# Patient Record
Sex: Male | Born: 1970
Health system: Southern US, Community
[De-identification: ages and names within clinical notes are randomized; demographics above are authoritative.]

## PROBLEM LIST (undated history)

## (undated) DIAGNOSIS — T7840XA Allergy, unspecified, initial encounter: Secondary | ICD-10-CM

## (undated) DIAGNOSIS — K200 Eosinophilic esophagitis: Secondary | ICD-10-CM

## (undated) HISTORY — DX: Allergy, unspecified, initial encounter: T78.40XA

## (undated) HISTORY — PX: VASECTOMY: SHX75

## (undated) HISTORY — PX: WISDOM TOOTH EXTRACTION: SHX21

## (undated) HISTORY — DX: Eosinophilic esophagitis: K20.0

---

## 2005-11-03 ENCOUNTER — Other Ambulatory Visit: Admission: RE | Admit: 2005-11-03 | Discharge: 2005-11-03 | Payer: Self-pay | Admitting: Urology

## 2012-05-03 ENCOUNTER — Encounter: Payer: Self-pay | Admitting: Orthopedic Surgery

## 2012-05-03 ENCOUNTER — Ambulatory Visit (INDEPENDENT_AMBULATORY_CARE_PROVIDER_SITE_OTHER): Payer: BC Managed Care – PPO

## 2012-05-03 ENCOUNTER — Ambulatory Visit (INDEPENDENT_AMBULATORY_CARE_PROVIDER_SITE_OTHER): Payer: BC Managed Care – PPO | Admitting: Orthopedic Surgery

## 2012-05-03 VITALS — BP 90/62 | Ht 70.0 in | Wt 164.0 lb

## 2012-05-03 DIAGNOSIS — M25561 Pain in right knee: Secondary | ICD-10-CM

## 2012-05-03 DIAGNOSIS — M25569 Pain in unspecified knee: Secondary | ICD-10-CM

## 2012-05-03 DIAGNOSIS — M25469 Effusion, unspecified knee: Secondary | ICD-10-CM | POA: Insufficient documentation

## 2012-05-03 NOTE — Patient Instructions (Signed)
Wear compression sleeve

## 2012-05-03 NOTE — Progress Notes (Signed)
  Subjective:    Patient ID: Russell Chen, male    DOB: 10-May-1971, 41 y.o.   MRN: [id number] Chief Complaint  Patient presents with  . Knee Pain    right knee pain x 2 months, sudden onset, no known injury    HPI Comments: Mass right knee since June 2013  No trauma  Treated with naprosyn    He has a slight dull, aching pain in the anterior aspect of his knee associated with a cystic mass and a small foreign body possibly in the subcutaneous tissue, associated with some swelling 1/10 pain   Knee Pain       Review of Systems  All other systems reviewed and are negative.  seasonal allergies     Objective:   Physical Exam  Constitutional: He is oriented to person, place, and time. He appears well-developed and well-nourished.  Cardiovascular: Intact distal pulses.   Pulmonary/Chest: Effort normal.  Musculoskeletal:       Right knee: He exhibits swelling. He exhibits normal range of motion, no effusion, no ecchymosis, no deformity, no laceration, no erythema, normal alignment, no LCL laxity, normal patellar mobility, no bony tenderness, normal meniscus and no MCL laxity. tenderness found. No medial joint line, no lateral joint line, no MCL, no LCL and no patellar tendon tenderness noted.       Legs: Neurological: He is alert and oriented to person, place, and time. He has normal reflexes. He exhibits normal muscle tone.  Skin: Skin is warm and dry. No rash noted. No erythema. No pallor.  Psychiatric: He has a normal mood and affect. His behavior is normal. Judgment and thought content normal.     Imaging shows soft tissue mass over the RIGHT knee joint surfaces are normal     Assessment & Plan:  Cystic mass prepatellar bursa with a small possible foreign body in the subcutaneous tissue.  Procedure note Aspiration injection RIGHT prepatellar bursa  Medication used lidocaine 1% 1 cc Depo-Medrol 40 mg 1 cc  Preparation alcohol and ethyl chloride Verbal consent, and  timeout procedure. Technique: the skin was prepped with alcohol and anesthetized with skin refrigerant. We aspirated approximately 5 cc of bloody fluid and then injected the Depo-Medrol and lidocaine.  No complications

## 2012-05-17 ENCOUNTER — Encounter: Payer: Self-pay | Admitting: Orthopedic Surgery

## 2012-05-17 ENCOUNTER — Ambulatory Visit (INDEPENDENT_AMBULATORY_CARE_PROVIDER_SITE_OTHER): Payer: BC Managed Care – PPO | Admitting: Orthopedic Surgery

## 2012-05-17 VITALS — BP 90/60 | Ht 70.0 in | Wt 164.0 lb

## 2012-05-17 DIAGNOSIS — M25469 Effusion, unspecified knee: Secondary | ICD-10-CM

## 2012-05-17 NOTE — Patient Instructions (Addendum)
Scheduling right knee excision of mass and foreign body   You have been scheduled for surgery  Please Go to your preoperative appointment  Please stop all blood thinners ibuprofen Naprosyn aspirin Plavix Coumadin

## 2012-05-17 NOTE — Progress Notes (Signed)
Patient ID: Russell Chen, male   DOB: [birth year], 41 y.o.   MRN: [id number] Chief Complaint  Patient presents with  . Follow-up    follow up right knee bursa    BP 90/60  Ht 5\' 10"  (1.778 m)  Wt 164 lb (74.39 kg)  BMI 23.53 kg/m2  Followup after aspiration of the right knee for prepatellar bursitis with cyst formation. No improvement  There's also a foreign body in the soft tissue  Review of systems no fever or chills weight loss  Exam repeat exam shows a mass over the right knee does not felt the knee joint are his ambulation his knee is stable strength is normal skin is intact there is no surrounding erythema neurovascular exam is normal distally  Mass right knee from prepatellar bursitis  Arm body  Excision of mass and foreign body has been scheduled. The patient has been counseled on the risks and benefits of the procedure including potential complications wishes to proceed  Surgery will be scheduled at a later time including a full history and physical

## 2012-05-18 ENCOUNTER — Encounter (HOSPITAL_COMMUNITY): Payer: Self-pay | Admitting: Pharmacy Technician

## 2012-05-22 ENCOUNTER — Encounter (HOSPITAL_COMMUNITY): Payer: Self-pay

## 2012-05-22 ENCOUNTER — Encounter (HOSPITAL_COMMUNITY)
Admission: RE | Admit: 2012-05-22 | Discharge: 2012-05-22 | Disposition: A | Discharge status: Home / Self Care | Payer: BC Managed Care – PPO | Source: Ambulatory Visit | Attending: Orthopedic Surgery | Admitting: Orthopedic Surgery

## 2012-05-22 LAB — SURGICAL PCR SCREEN: MRSA, PCR: NEGATIVE

## 2012-05-22 NOTE — Patient Instructions (Addendum)
   20 Russell Chen  05/22/2012   Your procedure is scheduled on:   05/28/2012  Report to [facility] at  900  AM.  Call this number if you have problems the morning of surgery: [phone]   Remember:   Do not eat food:After Midnight.  May have clear liquids:until Midnight .    Take these medicines the morning of surgery with A SIP OF WATER:  none   Do not wear jewelry, make-up or nail polish.  Do not wear lotions, powders, or perfumes. You may wear deodorant.  Do not shave 48 hours prior to surgery. Men may shave face and neck.  Do not bring valuables to the hospital.  Contacts, dentures or bridgework may not be worn into surgery.  Leave suitcase in the car. After surgery it may be brought to your room.  For patients admitted to the hospital, checkout time is 11:00 AM the day of discharge.   Patients discharged the day of surgery will not be allowed to drive home.  Name and phone number of your driver: family  Special Instructions: CHG Shower Use Special Wash: 1/2 bottle night before surgery and 1/2 bottle morning of surgery.   Please read over the following fact sheets that you were given: Pain Booklet, MRSA Information, Surgical Site Infection Prevention, Anesthesia Post-op Instructions and Care and Recovery After Surgery PATIENT INSTRUCTIONS POST-ANESTHESIA  IMMEDIATELY FOLLOWING SURGERY:  Do not drive or operate machinery for the first twenty four hours after surgery.  Do not make any important decisions for twenty four hours after surgery or while taking narcotic pain medications or sedatives.  If you develop intractable nausea and vomiting or a severe headache please notify your doctor immediately.  FOLLOW-UP:  Please make an appointment with your surgeon as instructed. You do not need to follow up with anesthesia unless specifically instructed to do so.  WOUND CARE INSTRUCTIONS (if applicable):  Keep a dry clean dressing on the anesthesia/puncture wound site if there is drainage.   Once the wound has quit draining you may leave it open to air.  Generally you should leave the bandage intact for twenty four hours unless there is drainage.  If the epidural site drains for more than 36-48 hours please call the anesthesia department.  QUESTIONS?:  Please feel free to call your physician or the hospital operator if you have any questions, and they will be happy to assist you.

## 2012-05-23 ENCOUNTER — Telehealth: Payer: Self-pay | Admitting: Orthopedic Surgery

## 2012-05-23 NOTE — Telephone Encounter (Signed)
Contact to insurer,[employer], ph# [phone], regarding pre-authorization information, out-patient surgery, scheduled 05/25/12 at [facility].  CPT codes 09811, ICD9 code 726.65.  Per Maureen Ralphs, at insurer, no pre-authorization is required.  Her name and today's date 05/23/12 for reference.

## 2012-05-24 NOTE — H&P (Signed)
  Subjective:   Patient ID: Russell Chen, male DOB: 01-20-1971, 41 y.o. MRN: [id number]  Chief Complaint   Patient presents with   .  Knee Pain     right knee pain x 2 months, sudden onset, no known injury    HPI Comments: Mass right knee since June 2013  No trauma  Treated with naprosyn  He has a slight dull, aching pain in the anterior aspect of his knee associated with a cystic mass and a small foreign body possibly in the subcutaneous tissue, associated with some swelling  1/10 pain   WE ASPIRATED THE MASS AND IT CAME BACK. Knee Pain   Review of Systems  All other systems reviewed and are negative.  seasonal allergies   No past medical history on file.  No past surgical history on file.  Family History  Problem Relation Age of Onset  . Heart disease    . Arthritis    . Asthma    . Diabetes       reports that he quit smoking about 20 years ago. His smoking use included Cigarettes. He quit after .5 years of use. He does not have any smokeless tobacco history on file. He reports that he does not drink alcohol or use illicit drugs.  Objective:   Physical Exam  Constitutional: He is oriented to person, place, and time. He appears well-developed and well-nourished.  Cardiovascular: Intact distal pulses.  Pulmonary/Chest: Effort normal.  Musculoskeletal:  Right knee: He exhibits swelling. He exhibits normal range of motion, no effusion, no ecchymosis, no deformity, no laceration, no erythema, normal alignment, no LCL laxity, normal patellar mobility, no bony tenderness, normal meniscus and no MCL laxity. tenderness found. No medial joint line, no lateral joint line, no MCL, no LCL and no patellar tendon tenderness noted.  Legs: Neurological: He is alert and oriented to person, place, and time. He has normal reflexes. He exhibits normal muscle tone.  Skin: Skin is warm and dry. No rash noted. No erythema. No pallor.  Psychiatric: He has a normal mood and affect. His behavior  is normal. Judgment and thought content normal.   Imaging shows soft tissue mass over the RIGHT knee joint surfaces are normal   Assessment & Plan:   Cystic mass prepatellar bursa with a small possible foreign body in the subcutaneous tissue.  EXCISION OF MASS/BURSA RIGHT KNEE AND REMOVE FOREIGN BODY

## 2012-05-25 ENCOUNTER — Encounter (HOSPITAL_COMMUNITY): Payer: Self-pay | Admitting: Anesthesiology

## 2012-05-25 ENCOUNTER — Ambulatory Visit (HOSPITAL_COMMUNITY)
Admission: RE | Admit: 2012-05-25 | Discharge: 2012-05-25 | Disposition: A | Discharge status: Home / Self Care | Payer: BC Managed Care – PPO | Source: Ambulatory Visit | Attending: Orthopedic Surgery | Admitting: Orthopedic Surgery

## 2012-05-25 ENCOUNTER — Ambulatory Visit (HOSPITAL_COMMUNITY): Payer: BC Managed Care – PPO | Admitting: Anesthesiology

## 2012-05-25 ENCOUNTER — Encounter (HOSPITAL_COMMUNITY)
Admission: RE | Disposition: A | Discharge status: Home / Self Care | Payer: Self-pay | Source: Ambulatory Visit | Attending: Orthopedic Surgery

## 2012-05-25 ENCOUNTER — Encounter (HOSPITAL_COMMUNITY): Payer: Self-pay | Admitting: *Deleted

## 2012-05-25 DIAGNOSIS — M799 Soft tissue disorder, unspecified: Secondary | ICD-10-CM | POA: Insufficient documentation

## 2012-05-25 DIAGNOSIS — M25469 Effusion, unspecified knee: Secondary | ICD-10-CM

## 2012-05-25 DIAGNOSIS — M6740 Ganglion, unspecified site: Secondary | ICD-10-CM | POA: Insufficient documentation

## 2012-05-25 DIAGNOSIS — M795 Residual foreign body in soft tissue: Secondary | ICD-10-CM | POA: Insufficient documentation

## 2012-05-25 DIAGNOSIS — R2240 Localized swelling, mass and lump, unspecified lower limb: Secondary | ICD-10-CM

## 2012-05-25 DIAGNOSIS — R29898 Other symptoms and signs involving the musculoskeletal system: Secondary | ICD-10-CM

## 2012-05-25 DIAGNOSIS — Z01812 Encounter for preprocedural laboratory examination: Secondary | ICD-10-CM | POA: Insufficient documentation

## 2012-05-25 DIAGNOSIS — M7040 Prepatellar bursitis, unspecified knee: Secondary | ICD-10-CM

## 2012-05-25 HISTORY — PX: MASS EXCISION: SHX2000

## 2012-05-25 HISTORY — PX: FOREIGN BODY REMOVAL: SHX962

## 2012-05-25 SURGERY — EXCISION MASS
Anesthesia: General | Site: Knee | Laterality: Right | Wound class: Clean

## 2012-05-25 MED ORDER — LIDOCAINE HCL (CARDIAC) 10 MG/ML IV SOLN
INTRAVENOUS | Status: DC | PRN
  Administered 2012-05-25: 40 mg via INTRAVENOUS

## 2012-05-25 MED ORDER — MIDAZOLAM HCL 2 MG/2ML IJ SOLN
1.0000 mg | INTRAMUSCULAR | Status: DC | PRN
  Administered 2012-05-25 (×2): 2 mg via INTRAVENOUS

## 2012-05-25 MED ORDER — HYDROCODONE-ACETAMINOPHEN 5-325 MG PO TABS: 1.0000 | ORAL_TABLET | Freq: Four times a day (QID) | ORAL | Status: AC | PRN

## 2012-05-25 MED ORDER — ONDANSETRON HCL 4 MG/2ML IJ SOLN
INTRAMUSCULAR | Status: AC
  Filled 2012-05-25: qty 2

## 2012-05-25 MED ORDER — FENTANYL CITRATE 0.05 MG/ML IJ SOLN
INTRAMUSCULAR | Status: AC
  Filled 2012-05-25: qty 5

## 2012-05-25 MED ORDER — BUPIVACAINE-EPINEPHRINE PF 0.5-1:200000 % IJ SOLN
INTRAMUSCULAR | Status: AC
  Filled 2012-05-25: qty 20

## 2012-05-25 MED ORDER — FENTANYL CITRATE 0.05 MG/ML IJ SOLN
INTRAMUSCULAR | Status: DC | PRN
  Administered 2012-05-25: 100 ug via INTRAVENOUS

## 2012-05-25 MED ORDER — BUPIVACAINE-EPINEPHRINE PF 0.5-1:200000 % IJ SOLN
INTRAMUSCULAR | Status: DC | PRN
  Administered 2012-05-25: 30 mL

## 2012-05-25 MED ORDER — LIDOCAINE HCL (PF) 1 % IJ SOLN
INTRAMUSCULAR | Status: AC
  Filled 2012-05-25: qty 5

## 2012-05-25 MED ORDER — PROPOFOL 10 MG/ML IV EMUL
INTRAVENOUS | Status: AC
  Filled 2012-05-25: qty 20

## 2012-05-25 MED ORDER — MIDAZOLAM HCL 2 MG/2ML IJ SOLN
INTRAMUSCULAR | Status: AC
  Filled 2012-05-25: qty 2

## 2012-05-25 MED ORDER — LACTATED RINGERS IV SOLN
INTRAVENOUS | Status: DC
  Administered 2012-05-25: 13:00:00 via INTRAVENOUS
  Administered 2012-05-25: 1000 mL via INTRAVENOUS

## 2012-05-25 MED ORDER — CHLORHEXIDINE GLUCONATE 4 % EX LIQD: 60.0000 mL | Freq: Once | CUTANEOUS | Status: DC

## 2012-05-25 MED ORDER — KETOROLAC TROMETHAMINE 30 MG/ML IJ SOLN
30.0000 mg | Freq: Once | INTRAMUSCULAR | Status: AC
  Administered 2012-05-25: 30 mg via INTRAVENOUS

## 2012-05-25 MED ORDER — GLYCOPYRROLATE 0.2 MG/ML IJ SOLN
0.2000 mg | Freq: Once | INTRAMUSCULAR | Status: AC
  Administered 2012-05-25: 0.2 mg via INTRAVENOUS

## 2012-05-25 MED ORDER — GLYCOPYRROLATE 0.2 MG/ML IJ SOLN
INTRAMUSCULAR | Status: AC
  Filled 2012-05-25: qty 1

## 2012-05-25 MED ORDER — CEFAZOLIN SODIUM-DEXTROSE 2-3 GM-% IV SOLR
2.0000 g | INTRAVENOUS | Status: AC
  Administered 2012-05-25: 2 g via INTRAVENOUS

## 2012-05-25 MED ORDER — ONDANSETRON HCL 4 MG/2ML IJ SOLN
4.0000 mg | Freq: Once | INTRAMUSCULAR | Status: AC
  Administered 2012-05-25: 4 mg via INTRAVENOUS

## 2012-05-25 MED ORDER — FENTANYL CITRATE 0.05 MG/ML IJ SOLN: 25.0000 ug | INTRAMUSCULAR | Status: DC | PRN

## 2012-05-25 MED ORDER — KETOROLAC TROMETHAMINE 30 MG/ML IJ SOLN
INTRAMUSCULAR | Status: AC
  Filled 2012-05-25: qty 1

## 2012-05-25 MED ORDER — PROPOFOL 10 MG/ML IV BOLUS
INTRAVENOUS | Status: DC | PRN
  Administered 2012-05-25: 250 mg via INTRAVENOUS

## 2012-05-25 MED ORDER — SODIUM CHLORIDE 0.9 % IR SOLN
Status: DC | PRN
  Administered 2012-05-25: 1000 mL

## 2012-05-25 MED ORDER — CEFAZOLIN SODIUM-DEXTROSE 2-3 GM-% IV SOLR
INTRAVENOUS | Status: AC
  Filled 2012-05-25: qty 50

## 2012-05-25 MED ORDER — ONDANSETRON HCL 4 MG/2ML IJ SOLN: 4.0000 mg | Freq: Once | INTRAMUSCULAR | Status: DC | PRN

## 2012-05-25 SURGICAL SUPPLY — 50 items
ADH SKN CLS APL DERMABOND .7 (GAUZE/BANDAGES/DRESSINGS) ×2
BAG HAMPER (MISCELLANEOUS) ×2 IMPLANT
BANDAGE ELASTIC 6 VELCRO NS (GAUZE/BANDAGES/DRESSINGS) ×1 IMPLANT
BANDAGE ESMARK 4X12 BL STRL LF (DISPOSABLE) ×1 IMPLANT
BANDAGE GAUZE ELAST BULKY 4 IN (GAUZE/BANDAGES/DRESSINGS) ×2 IMPLANT
BLADE SURG SZ10 CARB STEEL (BLADE) ×1 IMPLANT
BNDG CMPR 12X4 ELC STRL LF (DISPOSABLE) ×1
BNDG COHESIVE 4X5 TAN NS LF (GAUZE/BANDAGES/DRESSINGS) ×2 IMPLANT
BNDG ESMARK 4X12 BLUE STRL LF (DISPOSABLE) ×2
CHLORAPREP W/TINT 26ML (MISCELLANEOUS) ×2 IMPLANT
CLOTH BEACON ORANGE TIMEOUT ST (SAFETY) ×2 IMPLANT
COVER LIGHT HANDLE STERIS (MISCELLANEOUS) ×4 IMPLANT
CUFF TOURNIQUET SINGLE 34IN LL (TOURNIQUET CUFF) ×1 IMPLANT
DECANTER SPIKE VIAL GLASS SM (MISCELLANEOUS) ×2 IMPLANT
DERMABOND ADVANCED (GAUZE/BANDAGES/DRESSINGS) ×2
DERMABOND ADVANCED .7 DNX12 (GAUZE/BANDAGES/DRESSINGS) IMPLANT
DRAPE INCISE IOBAN 44X35 STRL (DRAPES) ×1 IMPLANT
DRSG XEROFORM 1X8 (GAUZE/BANDAGES/DRESSINGS) ×2 IMPLANT
ELECT REM PT RETURN 9FT ADLT (ELECTROSURGICAL) ×2
ELECTRODE REM PT RTRN 9FT ADLT (ELECTROSURGICAL) ×1 IMPLANT
FORMALIN 10 PREFIL 120ML (MISCELLANEOUS) ×3 IMPLANT
GAUZE XEROFORM 5X9 LF (GAUZE/BANDAGES/DRESSINGS) ×1 IMPLANT
GLOVE BIO SURGEON STRL SZ 6.5 (GLOVE) ×1 IMPLANT
GLOVE BIOGEL PI IND STRL 7.0 (GLOVE) IMPLANT
GLOVE BIOGEL PI INDICATOR 7.0 (GLOVE) ×3
GLOVE ECLIPSE 6.5 STRL STRAW (GLOVE) ×1 IMPLANT
GLOVE ECLIPSE 7.0 STRL STRAW (GLOVE) ×1 IMPLANT
GLOVE EXAM NITRILE MD LF STRL (GLOVE) ×1 IMPLANT
GLOVE SKINSENSE NS SZ8.0 LF (GLOVE) ×1
GLOVE SKINSENSE STRL SZ8.0 LF (GLOVE) ×1 IMPLANT
GLOVE SS N UNI LF 8.5 STRL (GLOVE) ×2 IMPLANT
GOWN STRL REIN XL XLG (GOWN DISPOSABLE) ×8 IMPLANT
INST SET MINOR BONE (KITS) ×2 IMPLANT
KIT ROOM TURNOVER APOR (KITS) ×2 IMPLANT
MANIFOLD NEPTUNE II (INSTRUMENTS) ×2 IMPLANT
NDL HYPO 21X1.5 SAFETY (NEEDLE) ×1 IMPLANT
NEEDLE HYPO 21X1.5 SAFETY (NEEDLE) ×2 IMPLANT
NS IRRIG 1000ML POUR BTL (IV SOLUTION) ×2 IMPLANT
PACK BASIC LIMB (CUSTOM PROCEDURE TRAY) ×2 IMPLANT
PAD ABD 5X9 TENDERSORB (GAUZE/BANDAGES/DRESSINGS) ×1 IMPLANT
PAD ARMBOARD 7.5X6 YLW CONV (MISCELLANEOUS) ×2 IMPLANT
PADDING CAST COTTON 6X4 STRL (CAST SUPPLIES) ×1 IMPLANT
SET BASIN LINEN APH (SET/KITS/TRAYS/PACK) ×2 IMPLANT
SPONGE GAUZE 4X4 12PLY (GAUZE/BANDAGES/DRESSINGS) ×2 IMPLANT
SPONGE LAP 18X18 X RAY DECT (DISPOSABLE) ×1 IMPLANT
STRIP CLOSURE SKIN 1/2X4 (GAUZE/BANDAGES/DRESSINGS) ×2 IMPLANT
SUT MON AB 2-0 SH 27 (SUTURE) ×2
SUT MON AB 2-0 SH27 (SUTURE) ×1 IMPLANT
SYR 30ML LL (SYRINGE) ×1 IMPLANT
SYR BULB IRRIGATION 50ML (SYRINGE) ×2 IMPLANT

## 2012-05-25 NOTE — Anesthesia Postprocedure Evaluation (Signed)
Anesthesia Post Note  Patient: Russell Chen  Procedure(s) Performed: Procedure(s) (LRB): EXCISION MASS (Right) FOREIGN BODY REMOVAL ADULT (Right)  Anesthesia type: General  Patient location: PACU  Post pain: Pain level controlled  Post assessment: Post-op Vital signs reviewed, Patient's Cardiovascular Status Stable, Respiratory Function Stable, Patent Airway, No signs of Nausea or vomiting and Pain level controlled  Last Vitals:  Filed Vitals:   05/25/12 1332  BP: 99/65  Pulse: 58  Temp: 36.5 C  Resp: 26    Post vital signs: Reviewed and stable  Level of consciousness: awake and alert   Complications: No apparent anesthesia complications

## 2012-05-25 NOTE — Op Note (Signed)
05/25/2012  1:29 PM  PATIENT:  Otho Perl  41 y.o. male  PRE-OPERATIVE DIAGNOSIS:  right knee mass and foreign body  POST-OPERATIVE DIAGNOSIS:  right knee mass and foreign body  PROCEDURE:  Procedure(s) (LRB) with comments: EXCISION MASS (Right) - right knee mass FOREIGN BODY REMOVAL ADULT (Right) - right knee foreign body  Operative findings  #1 there was a small 3 x 3 mm dark firm nodular mass in the subcutaneous tissue of the right knee just superomedial to the larger mass.  #2 there was a large 3.5 x 4 cm thickened blood-filled cystic mass under the subcutaneous tissue of the right knee superficial to the patellar tendon. It did not appear to involve the peritenon.  Details of procedure: The patient was identified in the preop area. The site was confirmed as the right knee and was marked. The chart was reviewed. The patient was taken to the operating room and general anesthesia was administered with an element technique without complication. 2 g of Ancef were started IV per weight base criteria. After sterile prep and drape the timeout procedure was completed. The patient identification was confirmed, IV antibiotics were confirmed to be given before skin incision, equipment for the procedure confirmed. Site was confirmed.  The limb was exsanguinated with a 4 inch Esmarch and the tourniquet was inflated to 300 mm of mercury. A midline incision was made over the mass and extended slightly proximal and distal to it. Blunt dissection was carried around the mass and the small mass was removed. The larger mass was then removed with blunt and sharp dissection and was separated from the underlying peritenon. There appeared to be a stalk coming from the superolateral joint margin.  The mass was opened and found to be thickened walls with blood which was old inside.  Exploration of the wound revealed no other lesions or masses. The wound was irrigated with copious amounts of saline.  A  running 2-0 Monocryl suture was placed and then Dermabond was applied along with Steri-Strips and a sterile dressing. The tourniquet was released the limb had excellent capillary refill color.  A sterile bandage was applied with a Ace bandage.    SURGEON:  Surgeon(s) and Role:    * Vickki Hearing, MD - Primary  PHYSICIAN ASSISTANT:   ASSISTANTS: Joppa Nation   ANESTHESIA:   general  EBL:  Total I/O In: 1200 [I.V.:1200] Out: 0   BLOOD ADMINISTERED:none  DRAINS: none   LOCAL MEDICATIONS USED:  MARCAINE 0 .5% , Amount: 30 ml and OTHER epi   SPECIMEN:  Source of Specimen:  2 specimens one small 1 as described above in one large cystic mass right knee the pain is tissue  DISPOSITION OF SPECIMEN:  PATHOLOGY  COUNTS:  YES  TOURNIQUET:   Total Tourniquet Time Documented: Thigh (Right) - 32 minutes  DICTATION: .Dragon Dictation  PLAN OF CARE: Discharge to home after PACU  PATIENT DISPOSITION:  PACU - hemodynamically stable.   Delay start of Pharmacological VTE agent (>24hrs) due to surgical blood loss or risk of bleeding: not applicable

## 2012-05-25 NOTE — Brief Op Note (Addendum)
05/25/2012  1:29 PM  PATIENT:  Russell Chen  41 y.o. male  PRE-OPERATIVE DIAGNOSIS:  right knee mass and foreign body  POST-OPERATIVE DIAGNOSIS:  right knee mass and foreign body  PROCEDURE:  Procedure(s) (LRB) with comments: EXCISION MASS (Right) - right knee mass FOREIGN BODY REMOVAL ADULT (Right) - right knee foreign body  Operative findings  #1 there was a small 3 x 3 mm dark firm nodular mass in the subcutaneous tissue of the right knee just superomedial to the larger mass.  #2 there was a large 3.5 x 4 cm thickened blood-filled cystic mass under the subcutaneous tissue of the right knee superficial to the patellar tendon. It did not appear to involve the peritenon.  Details of procedure: The patient was identified in the preop area. The site was confirmed as the right knee and was marked. The chart was reviewed. The patient was taken to the operating room and general anesthesia was administered with an element technique without complication. 2 g of Ancef were started IV per weight base criteria. After sterile prep and drape the timeout procedure was completed. The patient identification was confirmed, IV antibiotics were confirmed to be given before skin incision, equipment for the procedure confirmed. Site was confirmed.  The limb was exsanguinated with a 4 inch Esmarch and the tourniquet was inflated to 300 mm of mercury. A midline incision was made over the mass and extended slightly proximal and distal to it. Blunt dissection was carried around the mass and the small mass was removed. The larger mass was then removed with blunt and sharp dissection and was separated from the underlying peritenon. There appeared to be a stalk coming from the superolateral joint margin.  The mass was opened and found to be thickened walls with blood which was old inside.  Exploration of the wound revealed no other lesions or masses. The wound was irrigated with copious amounts of saline.  A  running 2-0 Monocryl suture was placed and then Dermabond was applied along with Steri-Strips and a sterile dressing. The tourniquet was released the limb had excellent capillary refill color.  A sterile bandage was applied with a Ace bandage.    SURGEON:  Surgeon(s) and Role:    * Stanley E Harrison, MD - Primary  PHYSICIAN ASSISTANT:   ASSISTANTS: Betty Ashley   ANESTHESIA:   general  EBL:  Total I/O In: 1200 [I.V.:1200] Out: 0   BLOOD ADMINISTERED:none  DRAINS: none   LOCAL MEDICATIONS USED:  MARCAINE 0 .5% , Amount: 30 ml and OTHER epi   SPECIMEN:  Source of Specimen:  2 specimens one small 1 as described above in one large cystic mass right knee the pain is tissue  DISPOSITION OF SPECIMEN:  PATHOLOGY  COUNTS:  YES  TOURNIQUET:   Total Tourniquet Time Documented: Thigh (Right) - 32 minutes  DICTATION: .Dragon Dictation  PLAN OF CARE: Discharge to home after PACU  PATIENT DISPOSITION:  PACU - hemodynamically stable.   Delay start of Pharmacological VTE agent (>24hrs) due to surgical blood loss or risk of bleeding: not applicable 

## 2012-05-25 NOTE — Interval H&P Note (Signed)
History and Physical Interval Note:  05/25/2012 12:08 PM  Russell Chen  has presented today for surgery, with the diagnosis of right knee mass and foreign body  The various methods of treatment have been discussed with the patient and family. After consideration of risks, benefits and other options for treatment, the patient has consented to  Procedure(s) (LRB) with comments: EXCISION MASS (Right) - right knee mass FOREIGN BODY REMOVAL ADULT (Right) - right knee foreign body as a surgical intervention .  The patient's history has been reviewed, patient examined, no change in status, stable for surgery.  I have reviewed the patient's chart and labs.  Questions were answered to the patient's satisfaction.     Fuller Canada

## 2012-05-25 NOTE — Anesthesia Preprocedure Evaluation (Signed)
Anesthesia Evaluation  Patient identified by MRN, date of birth, ID band Patient awake    Reviewed: Allergy & Precautions, H&P , NPO status , Patient's Chart, lab work & pertinent test results  Airway Mallampati: I TM Distance: >3 FB Neck ROM: Full    Dental No notable dental hx.    Pulmonary neg pulmonary ROS,    Pulmonary exam normal       Cardiovascular negative cardio ROS  Rhythm:Regular Rate:Normal     Neuro/Psych negative neurological ROS  negative psych ROS   GI/Hepatic negative GI ROS, Neg liver ROS,   Endo/Other  negative endocrine ROS  Renal/GU negative Renal ROS     Musculoskeletal negative musculoskeletal ROS (+)   Abdominal Normal abdominal exam  (+)   Peds  Hematology negative hematology ROS (+)   Anesthesia Other Findings   Reproductive/Obstetrics                           Anesthesia Physical Anesthesia Plan  ASA: I  Anesthesia Plan: General   Post-op Pain Management:    Induction: Intravenous  Airway Management Planned: LMA  Additional Equipment:   Intra-op Plan:   Post-operative Plan: Extubation in OR  Informed Consent: I have reviewed the patients History and Physical, chart, labs and discussed the procedure including the risks, benefits and alternatives for the proposed anesthesia with the patient or authorized representative who has indicated his/her understanding and acceptance.     Plan Discussed with: CRNA  Anesthesia Plan Comments:         Anesthesia Quick Evaluation

## 2012-05-25 NOTE — Transfer of Care (Signed)
Immediate Anesthesia Transfer of Care Note  Patient: Russell Chen  Procedure(s) Performed: Procedure(s) (LRB): EXCISION MASS (Right) FOREIGN BODY REMOVAL ADULT (Right)  Patient Location: PACU  Anesthesia Type: General  Level of Consciousness: awake  Airway & Oxygen Therapy: Patient Spontanous Breathing and non-rebreather face mask  Post-op Assessment: Report given to PACU RN, Post -op Vital signs reviewed and stable and Patient moving all extremities  Post vital signs: Reviewed and stable  Complications: No apparent anesthesia complications

## 2012-05-28 ENCOUNTER — Encounter: Payer: Self-pay | Admitting: Orthopedic Surgery

## 2012-05-28 ENCOUNTER — Ambulatory Visit (INDEPENDENT_AMBULATORY_CARE_PROVIDER_SITE_OTHER): Payer: BC Managed Care – PPO | Admitting: Orthopedic Surgery

## 2012-05-28 VITALS — BP 110/70 | Ht 70.0 in | Wt 168.0 lb

## 2012-05-28 DIAGNOSIS — M6740 Ganglion, unspecified site: Secondary | ICD-10-CM

## 2012-05-28 DIAGNOSIS — M7040 Prepatellar bursitis, unspecified knee: Secondary | ICD-10-CM

## 2012-05-28 NOTE — Patient Instructions (Signed)
Shower ok    

## 2012-05-28 NOTE — Progress Notes (Signed)
Patient ID: Russell Chen, male   DOB: 1971/01/24, 41 y.o.   MRN: [id number] Post op   Chief Complaint  Patient presents with  . Routine Post Op    post op 1, SARK, DOS 05/25/12    The pathology report indicates the patient had a ganglion cyst in his right knee  His wound looks clean and dry. Return to work tomorrow. She refrain from any strenuous activity for the next 2 weeks now check his wound in 2 weeks. He is allowed to shower with a clear plastic dressing/Tegaderm over the wound

## 2012-05-29 ENCOUNTER — Encounter (HOSPITAL_COMMUNITY): Payer: Self-pay | Admitting: Orthopedic Surgery

## 2012-06-06 ENCOUNTER — Encounter: Payer: Self-pay | Admitting: Orthopedic Surgery

## 2012-06-06 ENCOUNTER — Ambulatory Visit (INDEPENDENT_AMBULATORY_CARE_PROVIDER_SITE_OTHER): Payer: BC Managed Care – PPO | Admitting: Orthopedic Surgery

## 2012-06-06 VITALS — BP 104/70 | Ht 70.0 in | Wt 168.0 lb

## 2012-06-06 DIAGNOSIS — M6740 Ganglion, unspecified site: Secondary | ICD-10-CM

## 2012-06-06 NOTE — Patient Instructions (Signed)
Regular activity  No kneeling x 1 week

## 2012-06-06 NOTE — Progress Notes (Signed)
Patient ID: Russell Chen, male   DOB: 1971/04/27, 41 y.o.   MRN: [id number] Chief Complaint  Patient presents with  . Follow-up    Wound check.     Post op excision of cyst right knee   Clean healed wound without clinical signs of infection   D/C

## 2013-01-10 ENCOUNTER — Encounter: Payer: Self-pay | Admitting: *Deleted

## 2013-01-17 ENCOUNTER — Ambulatory Visit (INDEPENDENT_AMBULATORY_CARE_PROVIDER_SITE_OTHER): Payer: BC Managed Care – PPO | Admitting: Family Medicine

## 2013-01-17 ENCOUNTER — Encounter: Payer: Self-pay | Admitting: Family Medicine

## 2013-01-17 VITALS — BP 100/70 | HR 60 | Ht 69.0 in | Wt 167.2 lb

## 2013-01-17 DIAGNOSIS — Z0000 Encounter for general adult medical examination without abnormal findings: Secondary | ICD-10-CM

## 2013-01-17 NOTE — Progress Notes (Signed)
  Subjective:    Patient ID: Russell Chen, male    DOB: 11-16-1970, 42 y.o.   MRN: [id number]  HPI Patient overall doing well eats healthy he tries exercise moderate amount denies any chest pressure tightness pain shortness of breath. Denies rectal bleeding or hematuria. Gets adequate sleep. Safety measures dietary measures exercise all reviewed with patient. Patient does not smoke or drink. Past medical history not pertinent for any type of major issues.   Review of Systems  Constitutional: Negative for fever, activity change and appetite change.  HENT: Negative for congestion, rhinorrhea and neck pain.   Eyes: Negative for discharge.  Respiratory: Negative for cough and wheezing.   Cardiovascular: Negative for chest pain.  Gastrointestinal: Negative for vomiting, abdominal pain and blood in stool.  Genitourinary: Negative for frequency and difficulty urinating.  Skin: Negative for rash.  Allergic/Immunologic: Negative for environmental allergies and food allergies.  Neurological: Negative for weakness and headaches.  Psychiatric/Behavioral: Negative for agitation.       Objective:   Physical Exam  Constitutional: He appears well-developed and well-nourished.  HENT:  Head: Normocephalic and atraumatic.  Right Ear: External ear normal.  Left Ear: External ear normal.  Nose: Nose normal.  Mouth/Throat: Oropharynx is clear and moist.  Eyes: EOM are normal. Pupils are equal, round, and reactive to light.  Neck: Normal range of motion. Neck supple. No thyromegaly present.  Cardiovascular: Normal rate, regular rhythm and normal heart sounds.   No murmur heard. Pulmonary/Chest: Effort normal and breath sounds normal. No respiratory distress. He has no wheezes.  Abdominal: Soft. Bowel sounds are normal. He exhibits no distension and no mass. There is no tenderness.  Genitourinary: Penis normal.  Musculoskeletal: Normal range of motion. He exhibits no edema.  Lymphadenopathy:    He  has no cervical adenopathy.  Neurological: He is alert. He exhibits normal muscle tone.  Skin: Skin is warm and dry. No erythema.  Psychiatric: He has a normal mood and affect. His behavior is normal. Judgment normal.          Assessment & Plan:  On we'llwellness exam-overall good. His findings are good. Dietary measures safety measures all discussed with the patient. Patient should get lab work in addition to this I do recommend a routine followup in one years time. Prostate exam colonoscopy not indicated currently.

## 2013-01-22 LAB — LIPID PANEL
HDL: 47 mg/dL (ref >39)
LDL Cholesterol: 112 mg/dL — ABNORMAL HIGH (ref 0–99)
Total CHOL/HDL Ratio: 3.6 Ratio
VLDL: 9 mg/dL (ref 0–40)

## 2013-01-23 ENCOUNTER — Encounter: Payer: Self-pay | Admitting: Family Medicine

## 2013-07-15 ENCOUNTER — Encounter: Payer: Self-pay | Admitting: Family Medicine

## 2013-07-15 ENCOUNTER — Ambulatory Visit (INDEPENDENT_AMBULATORY_CARE_PROVIDER_SITE_OTHER): Payer: BC Managed Care – PPO | Admitting: Family Medicine

## 2013-07-15 VITALS — BP 110/70 | Temp 98.7°F | Ht 70.0 in | Wt 171.0 lb

## 2013-07-15 DIAGNOSIS — J322 Chronic ethmoidal sinusitis: Secondary | ICD-10-CM

## 2013-07-15 MED ORDER — AMOXICILLIN 500 MG PO TABS: 500.0000 mg | ORAL_TABLET | Freq: Two times a day (BID) | ORAL | Status: DC

## 2013-07-15 MED ORDER — AMOXICILLIN 500 MG PO TABS: ORAL_TABLET | ORAL | Status: AC

## 2013-07-15 NOTE — Progress Notes (Signed)
  Subjective:    Patient ID: Russell Chen, male    DOB: 09-Aug-1971, 42 y.o.   MRN: [id number]  Headache  This is a new problem. The current episode started yesterday. Associated symptoms include eye watering, sinus pressure and a sore throat. Treatments tried: loratadine.   startews last night  Eyes burning, pressure right side,  thrat pain  Sinus headache and pressure, swimmy headed  nneg fever, sinus infxns once or twice per yr  Loratadine last eve Review of Systems  HENT: Positive for sinus pressure and sore throat.   Neurological: Positive for headaches.       Objective:   Physical Exam  Alert no acute distress. Lungs clear. Heart regular in rhythm. HEENT frontal maxillary tenderness right side TMs normal pharynx erythematous      Assessment & Plan:  Impression probable early sinusitis plan a mocks 500 3 times a day 10 days. Symptomatic care discussed. Warning signs discussed. WSL

## 2013-07-15 NOTE — Patient Instructions (Signed)
amox 555 three times per day for ten days

## 2013-09-13 ENCOUNTER — Encounter: Payer: Self-pay | Admitting: Family Medicine

## 2013-09-13 ENCOUNTER — Ambulatory Visit (INDEPENDENT_AMBULATORY_CARE_PROVIDER_SITE_OTHER): Payer: BC Managed Care – PPO | Admitting: Family Medicine

## 2013-09-13 VITALS — BP 100/72 | Temp 98.4°F | Ht 70.0 in | Wt 171.4 lb

## 2013-09-13 DIAGNOSIS — J019 Acute sinusitis, unspecified: Secondary | ICD-10-CM

## 2013-09-13 MED ORDER — LEVOFLOXACIN 500 MG PO TABS: 500.0000 mg | ORAL_TABLET | Freq: Every day | ORAL | Status: AC

## 2013-09-13 MED ORDER — HYDROCODONE-HOMATROPINE 5-1.5 MG/5ML PO SYRP: 5.0000 mL | ORAL_SOLUTION | Freq: Four times a day (QID) | ORAL | Status: DC | PRN

## 2013-09-13 NOTE — Progress Notes (Signed)
   Subjective:    Patient ID: Russell Chen, male    DOB: 03-Feb-1971, 42 y.o.   MRN: [id number]  Sinusitis This is a new problem. The current episode started in the past 7 days. The problem has been gradually worsening since onset. There has been no fever. His pain is at a severity of 6/10. The pain is moderate. Associated symptoms include congestion, coughing, headaches and sinus pressure. Pertinent negatives include no ear pain. Past treatments include oral decongestants. The treatment provided no relief.  satrted with URI sx for past 7 days Last few nights increased cough No wheeze PMH benign doesn't smoke  Review of Systems  Constitutional: Negative for fever and activity change.  HENT: Positive for congestion, rhinorrhea and sinus pressure. Negative for ear pain.   Eyes: Negative for discharge.  Respiratory: Positive for cough. Negative for wheezing.   Cardiovascular: Negative for chest pain.  Neurological: Positive for headaches.       Objective:   Physical Exam  Nursing note and vitals reviewed. Constitutional: He appears well-developed.  HENT:  Head: Normocephalic.  Mouth/Throat: Oropharynx is clear and moist. No oropharyngeal exudate.  Sinus pain  Neck: Normal range of motion.  Cardiovascular: Normal rate, regular rhythm and normal heart sounds.   No murmur heard. Pulmonary/Chest: Effort normal and breath sounds normal. He has no wheezes.  Lymphadenopathy:    He has no cervical adenopathy.  Neurological: He exhibits normal muscle tone.  Skin: Skin is warm and dry.    Moderate sinus tenderness      Assessment & Plan:  Acute sinusitis/antibiotics prescribed followup if ongoing troubles warning signs discussed 10 days Levaquin

## 2014-01-27 ENCOUNTER — Telehealth: Payer: Self-pay | Admitting: Family Medicine

## 2014-01-27 NOTE — Telephone Encounter (Signed)
Obviously this is unfortunate, but Dr. Richardson Landry would be able to do a wellness exam currently between 8 and 9 I am already scheduled up

## 2014-01-27 NOTE — Telephone Encounter (Signed)
Just FYI, patient is down for a physical with Dr. Richardson Landry tomorrow because his PCP in the computer was Dr. Richardson Landry, but it has now been changed to Dr. Nicki Reaper. I called his wife and let her know what was going on and offered to reschedule, but she said that he absolutely needed the physical tomorrow because he rearranged his schedule because he works out of town. She said he would be okay with seeing Dr. Richardson Landry, but I told her I would have to give the doctor and nurses a heads up and make sure it was okay.

## 2014-01-28 ENCOUNTER — Encounter: Payer: Self-pay | Admitting: Family Medicine

## 2014-01-28 ENCOUNTER — Ambulatory Visit (INDEPENDENT_AMBULATORY_CARE_PROVIDER_SITE_OTHER): Payer: BC Managed Care – PPO | Admitting: Family Medicine

## 2014-01-28 VITALS — BP 110/68 | HR 78 | Ht 69.5 in | Wt 168.0 lb

## 2014-01-28 DIAGNOSIS — Z0189 Encounter for other specified special examinations: Secondary | ICD-10-CM

## 2014-01-28 DIAGNOSIS — Z0000 Encounter for general adult medical examination without abnormal findings: Secondary | ICD-10-CM

## 2014-01-28 LAB — LIPID PANEL
CHOL/HDL RATIO: 2.9 ratio
Cholesterol: 159 mg/dL (ref 0–200)
HDL: 54 mg/dL (ref >39)
LDL CALC: 97 mg/dL (ref 0–99)
TRIGLYCERIDES: 39 mg/dL (ref <150)
VLDL: 8 mg/dL (ref 0–40)

## 2014-01-28 LAB — GLUCOSE, RANDOM: GLUCOSE: 85 mg/dL (ref 70–99)

## 2014-01-28 NOTE — Progress Notes (Signed)
   Subjective:    Patient ID: Russell Chen, male    DOB: [birth year], 43 y.o.   MRN: [id number]  HPIPhysical. Patient states no concerns today.   Family hx of diabetes  Exercise , [place]  No ex equip at home  Two kids,  Aha concerns disc  No fam hx of pros or colon ca  utd on tet shots  Review of Systems  Constitutional: Negative for fever, activity change and appetite change.  HENT: Negative for congestion and rhinorrhea.   Eyes: Negative for discharge.  Respiratory: Negative for cough and wheezing.   Cardiovascular: Negative for chest pain.  Gastrointestinal: Negative for vomiting, abdominal pain and blood in stool.  Genitourinary: Negative for frequency and difficulty urinating.  Musculoskeletal: Negative for neck pain.  Skin: Negative for rash.  Allergic/Immunologic: Negative for environmental allergies and food allergies.  Neurological: Negative for weakness and headaches.  Psychiatric/Behavioral: Negative for agitation.  All other systems reviewed and are negative.      Objective:   Physical Exam  Vitals reviewed. Constitutional: He appears well-developed and well-nourished.  HENT:  Head: Normocephalic and atraumatic.  Right Ear: External ear normal.  Left Ear: External ear normal.  Nose: Nose normal.  Mouth/Throat: Oropharynx is clear and moist.  Eyes: EOM are normal. Pupils are equal, round, and reactive to light.  Neck: Normal range of motion. Neck supple. No thyromegaly present.  Cardiovascular: Normal rate, regular rhythm and normal heart sounds.   No murmur heard. Pulmonary/Chest: Effort normal and breath sounds normal. No respiratory distress. He has no wheezes.  Abdominal: Soft. Bowel sounds are normal. He exhibits no distension and no mass. There is no tenderness.  Genitourinary: Penis normal.  Musculoskeletal: Normal range of motion. He exhibits no edema.  Lymphadenopathy:    He has no cervical adenopathy.  Neurological: He is alert.  He exhibits normal muscle tone.  Skin: Skin is warm and dry. No erythema.  Psychiatric: He has a normal mood and affect. His behavior is normal. Judgment normal.          Assessment & Plan:  Impression wellness exam plan diet discussed. Exercise discussed. Appropriate blood work. WSL

## 2014-01-30 ENCOUNTER — Encounter: Payer: Self-pay | Admitting: Family Medicine

## 2015-05-15 ENCOUNTER — Ambulatory Visit (INDEPENDENT_AMBULATORY_CARE_PROVIDER_SITE_OTHER): Payer: BLUE CROSS/BLUE SHIELD | Admitting: Family Medicine

## 2015-05-15 ENCOUNTER — Encounter: Payer: Self-pay | Admitting: Family Medicine

## 2015-05-15 VITALS — BP 102/74 | Ht 69.5 in | Wt 171.0 lb

## 2015-05-15 DIAGNOSIS — Z0000 Encounter for general adult medical examination without abnormal findings: Secondary | ICD-10-CM | POA: Diagnosis not present

## 2015-05-15 DIAGNOSIS — Z13220 Encounter for screening for lipoid disorders: Secondary | ICD-10-CM | POA: Diagnosis not present

## 2015-05-15 DIAGNOSIS — Z79899 Other long term (current) drug therapy: Secondary | ICD-10-CM | POA: Diagnosis not present

## 2015-05-15 NOTE — Progress Notes (Signed)
   Subjective:    Patient ID: Russell Chen, male    DOB: [birth year], 44 y.o.   MRN: [id number]  HPI The patient comes in today for a wellness visit.    A review of their health history was completed.  A review of medications was also completed.  Any needed refills: No meds currently  Eating habits: good  Falls/  MVA accidents in past few months: none  Regular exercise: yes  Specialist pt sees on regular basis: none  Preventative health issues were discussed.   Additional concerns: none   Review of Systems  Constitutional: Negative for fever, activity change and appetite change.  HENT: Negative for congestion and rhinorrhea.   Eyes: Negative for discharge.  Respiratory: Negative for cough and wheezing.   Cardiovascular: Negative for chest pain.  Gastrointestinal: Negative for vomiting, abdominal pain and blood in stool.  Genitourinary: Negative for frequency and difficulty urinating.  Musculoskeletal: Negative for neck pain.  Skin: Negative for rash.  Allergic/Immunologic: Negative for environmental allergies and food allergies.  Neurological: Negative for weakness and headaches.  Psychiatric/Behavioral: Negative for agitation.       Objective:   Physical Exam  Constitutional: He appears well-developed and well-nourished.  HENT:  Head: Normocephalic and atraumatic.  Right Ear: External ear normal.  Left Ear: External ear normal.  Nose: Nose normal.  Mouth/Throat: Oropharynx is clear and moist.  Eyes: EOM are normal. Pupils are equal, round, and reactive to light.  Neck: Normal range of motion. Neck supple. No thyromegaly present.  Cardiovascular: Normal rate, regular rhythm and normal heart sounds.   No murmur heard. Pulmonary/Chest: Effort normal and breath sounds normal. No respiratory distress. He has no wheezes.  Abdominal: Soft. Bowel sounds are normal. He exhibits no distension and no mass. There is no tenderness.  Genitourinary: Penis normal.    Musculoskeletal: Normal range of motion. He exhibits no edema.  Lymphadenopathy:    He has no cervical adenopathy.  Neurological: He is alert. He exhibits normal muscle tone.  Skin: Skin is warm and dry. No erythema.  Psychiatric: He has a normal mood and affect. His behavior is normal. Judgment normal.          Assessment & Plan:  Safety dietary all discussed. Patient does not need PSA or colonoscopy. Screening lab work ordered. Continue healthy eating regular physical activity. Follow-up in one year. Sooner if issues.

## 2015-05-16 ENCOUNTER — Encounter: Payer: Self-pay | Admitting: Family Medicine

## 2015-05-16 LAB — BASIC METABOLIC PANEL
BUN/Creatinine Ratio: 13 (ref 9–20)
BUN: 15 mg/dL (ref 6–24)
CHLORIDE: 104 mmol/L (ref 97–108)
CO2: 28 mmol/L (ref 18–29)
Calcium: 9.2 mg/dL (ref 8.7–10.2)
Creatinine, Ser: 1.12 mg/dL (ref 0.76–1.27)
GFR calc Af Amer: 92 mL/min/{1.73_m2} (ref >59)
GFR calc non Af Amer: 79 mL/min/{1.73_m2} (ref >59)
GLUCOSE: 93 mg/dL (ref 65–99)
POTASSIUM: 4.3 mmol/L (ref 3.5–5.2)
SODIUM: 144 mmol/L (ref 134–144)

## 2015-05-16 LAB — LIPID PANEL
CHOL/HDL RATIO: 3.9 ratio (ref 0.0–5.0)
Cholesterol, Total: 191 mg/dL (ref 100–199)
HDL: 49 mg/dL (ref >39)
LDL CALC: 130 mg/dL — AB (ref 0–99)
Triglycerides: 58 mg/dL (ref 0–149)
VLDL CHOLESTEROL CAL: 12 mg/dL (ref 5–40)

## 2016-03-04 ENCOUNTER — Ambulatory Visit: Payer: BLUE CROSS/BLUE SHIELD | Admitting: Nurse Practitioner

## 2016-06-23 DIAGNOSIS — Z23 Encounter for immunization: Secondary | ICD-10-CM | POA: Diagnosis not present

## 2016-08-31 ENCOUNTER — Telehealth: Payer: Self-pay | Admitting: Family Medicine

## 2016-08-31 DIAGNOSIS — R5383 Other fatigue: Secondary | ICD-10-CM

## 2016-08-31 DIAGNOSIS — Z13220 Encounter for screening for lipoid disorders: Secondary | ICD-10-CM

## 2016-08-31 NOTE — Telephone Encounter (Signed)
Pt is requesting lab orders to be sent over for an upcoming appt. Last labs per epic were: bmp and lipid, on 05/15/15.

## 2016-08-31 NOTE — Telephone Encounter (Signed)
Lipid liv met 7 cbc

## 2016-09-01 NOTE — Telephone Encounter (Signed)
LM on voicemail that order complete

## 2016-09-14 DIAGNOSIS — H5202 Hypermetropia, left eye: Secondary | ICD-10-CM | POA: Diagnosis not present

## 2016-09-14 DIAGNOSIS — H52203 Unspecified astigmatism, bilateral: Secondary | ICD-10-CM | POA: Diagnosis not present

## 2016-09-14 DIAGNOSIS — H524 Presbyopia: Secondary | ICD-10-CM | POA: Diagnosis not present

## 2016-09-14 DIAGNOSIS — R5383 Other fatigue: Secondary | ICD-10-CM | POA: Diagnosis not present

## 2016-09-14 DIAGNOSIS — Z13220 Encounter for screening for lipoid disorders: Secondary | ICD-10-CM | POA: Diagnosis not present

## 2016-09-15 ENCOUNTER — Encounter: Payer: Self-pay | Admitting: Family Medicine

## 2016-09-15 LAB — CBC WITH DIFFERENTIAL/PLATELET
BASOS ABS: 0.1 10*3/uL (ref 0.0–0.2)
BASOS: 1 %
EOS (ABSOLUTE): 0.2 10*3/uL (ref 0.0–0.4)
Eos: 4 %
HEMOGLOBIN: 14 g/dL (ref 13.0–17.7)
Hematocrit: 42.9 % (ref 37.5–51.0)
IMMATURE GRANS (ABS): 0 10*3/uL (ref 0.0–0.1)
IMMATURE GRANULOCYTES: 0 %
LYMPHS: 26 %
Lymphocytes Absolute: 1.1 10*3/uL (ref 0.7–3.1)
MCH: 27.5 pg (ref 26.6–33.0)
MCHC: 32.6 g/dL (ref 31.5–35.7)
MCV: 84 fL (ref 79–97)
MONOCYTES: 12 %
Monocytes Absolute: 0.5 10*3/uL (ref 0.1–0.9)
NEUTROS ABS: 2.4 10*3/uL (ref 1.4–7.0)
NEUTROS PCT: 57 %
PLATELETS: 191 10*3/uL (ref 150–379)
RBC: 5.1 x10E6/uL (ref 4.14–5.80)
RDW: 14.9 % (ref 12.3–15.4)
WBC: 4.3 10*3/uL (ref 3.4–10.8)

## 2016-09-15 LAB — LIPID PANEL
CHOL/HDL RATIO: 3.5 ratio (ref 0.0–5.0)
Cholesterol, Total: 184 mg/dL (ref 100–199)
HDL: 52 mg/dL (ref >39)
LDL CALC: 119 mg/dL — AB (ref 0–99)
Triglycerides: 65 mg/dL (ref 0–149)
VLDL CHOLESTEROL CAL: 13 mg/dL (ref 5–40)

## 2016-09-15 LAB — BASIC METABOLIC PANEL
BUN/Creatinine Ratio: 16 (ref 9–20)
BUN: 14 mg/dL (ref 6–24)
CALCIUM: 9.4 mg/dL (ref 8.7–10.2)
CHLORIDE: 105 mmol/L (ref 96–106)
CO2: 29 mmol/L (ref 18–29)
Creatinine, Ser: 0.86 mg/dL (ref 0.76–1.27)
GFR, EST AFRICAN AMERICAN: 121 mL/min/{1.73_m2} (ref >59)
GFR, EST NON AFRICAN AMERICAN: 105 mL/min/{1.73_m2} (ref >59)
Glucose: 82 mg/dL (ref 65–99)
POTASSIUM: 4.2 mmol/L (ref 3.5–5.2)
Sodium: 145 mmol/L — ABNORMAL HIGH (ref 134–144)

## 2016-09-15 LAB — HEPATIC FUNCTION PANEL
ALBUMIN: 4.5 g/dL (ref 3.5–5.5)
ALT: 27 IU/L (ref 0–44)
AST: 24 IU/L (ref 0–40)
Alkaline Phosphatase: 57 IU/L (ref 39–117)
BILIRUBIN TOTAL: 0.5 mg/dL (ref 0.0–1.2)
Bilirubin, Direct: 0.14 mg/dL (ref 0.00–0.40)
Total Protein: 6.6 g/dL (ref 6.0–8.5)

## 2016-09-16 ENCOUNTER — Encounter: Payer: Self-pay | Admitting: Family Medicine

## 2016-09-29 ENCOUNTER — Ambulatory Visit (INDEPENDENT_AMBULATORY_CARE_PROVIDER_SITE_OTHER): Payer: BLUE CROSS/BLUE SHIELD | Admitting: Family Medicine

## 2016-09-29 ENCOUNTER — Encounter: Payer: Self-pay | Admitting: Family Medicine

## 2016-09-29 VITALS — BP 130/78 | Ht 70.0 in | Wt 173.1 lb

## 2016-09-29 DIAGNOSIS — R358 Other polyuria: Secondary | ICD-10-CM | POA: Diagnosis not present

## 2016-09-29 DIAGNOSIS — Z125 Encounter for screening for malignant neoplasm of prostate: Secondary | ICD-10-CM

## 2016-09-29 DIAGNOSIS — Z0000 Encounter for general adult medical examination without abnormal findings: Secondary | ICD-10-CM

## 2016-09-29 DIAGNOSIS — R350 Frequency of micturition: Secondary | ICD-10-CM

## 2016-09-29 LAB — POCT URINALYSIS DIPSTICK
SPEC GRAV UA: 1.01
pH, UA: 7

## 2016-09-29 NOTE — Progress Notes (Signed)
   Subjective:    Patient ID: Russell Chen, male    DOB: 1971-06-13, 46 y.o.   MRN: [id number]  HPI The patient comes in today for a wellness visit.    A review of their health history was completed.  A review of medications was also completed.  Any needed refills:N/A  Eating habits: good  Falls/  MVA accidents in past few months: none  Regular exercise: yes  Specialist pt sees on regular basis: none  Preventative health issues were discussed.   Additional concerns: none The patient does relate that occasionally when he urinates he feels like he has to P again most of the time his urine flow is good but occasionally it somewhat slow he denies any high fevers or chills or sweats denies dysuria hematuria rectal bleeding  Review of Systems  Constitutional: Negative for activity change, appetite change and fever.  HENT: Negative for congestion and rhinorrhea.   Eyes: Negative for discharge.  Respiratory: Negative for cough and wheezing.   Cardiovascular: Negative for chest pain.  Gastrointestinal: Negative for abdominal pain, blood in stool and vomiting.  Genitourinary: Negative for difficulty urinating and frequency.  Musculoskeletal: Negative for neck pain.  Skin: Negative for rash.  Allergic/Immunologic: Negative for environmental allergies and food allergies.  Neurological: Negative for weakness and headaches.  Psychiatric/Behavioral: Negative for agitation.       Objective:   Physical Exam  Constitutional: He appears well-developed and well-nourished.  HENT:  Head: Normocephalic and atraumatic.  Right Ear: External ear normal.  Left Ear: External ear normal.  Nose: Nose normal.  Mouth/Throat: Oropharynx is clear and moist.  Eyes: EOM are normal. Pupils are equal, round, and reactive to light.  Neck: Normal range of motion. Neck supple. No thyromegaly present.  Cardiovascular: Normal rate, regular rhythm and normal heart sounds.   No murmur  heard. Pulmonary/Chest: Effort normal and breath sounds normal. No respiratory distress. He has no wheezes.  Abdominal: Soft. Bowel sounds are normal. He exhibits no distension and no mass. There is no tenderness.  Genitourinary: Penis normal.  Musculoskeletal: Normal range of motion. He exhibits no edema.  Lymphadenopathy:    He has no cervical adenopathy.  Neurological: He is alert. He exhibits normal muscle tone.  Skin: Skin is warm and dry. No erythema.  Psychiatric: He has a normal mood and affect. His behavior is normal. Judgment normal.   Patient does exercise, tries to eat healthy, stays physically active, gets decent amount of sleep, not under excessive stress  Prostate exam is normal soft Urinalysis with occasional bacteria We will send for culture and have patient do a PSA    Assessment & Plan:  Adult wellness-complete.wellness physical was conducted today. Importance of diet and exercise were discussed in detail. In addition to this a discussion regarding safety was also covered. We also reviewed over immunizations and gave recommendations regarding current immunization needed for age. In addition to this additional areas were also touched on including: Preventative health exams needed: Colonoscopy is not indicated at this time  Patient was advised yearly wellness exam  It is possible some of his symptoms are related to increased caffeine intake patient has been advised to try to taper down on this

## 2016-09-30 LAB — PSA: PROSTATE SPECIFIC AG, SERUM: 0.9 ng/mL (ref 0.0–4.0)

## 2016-10-01 LAB — URINE CULTURE: ORGANISM ID, BACTERIA: NO GROWTH

## 2016-11-21 DIAGNOSIS — Z029 Encounter for administrative examinations, unspecified: Secondary | ICD-10-CM

## 2017-03-06 ENCOUNTER — Ambulatory Visit (INDEPENDENT_AMBULATORY_CARE_PROVIDER_SITE_OTHER): Payer: BLUE CROSS/BLUE SHIELD | Admitting: Family Medicine

## 2017-03-06 ENCOUNTER — Encounter: Payer: Self-pay | Admitting: Family Medicine

## 2017-03-06 VITALS — BP 122/80 | Temp 98.8°F | Ht 70.0 in | Wt 177.0 lb

## 2017-03-06 DIAGNOSIS — W57XXXA Bitten or stung by nonvenomous insect and other nonvenomous arthropods, initial encounter: Secondary | ICD-10-CM | POA: Diagnosis not present

## 2017-03-06 DIAGNOSIS — R21 Rash and other nonspecific skin eruption: Secondary | ICD-10-CM | POA: Diagnosis not present

## 2017-03-06 MED ORDER — DOXYCYCLINE HYCLATE 100 MG PO TABS: 100.0000 mg | ORAL_TABLET | Freq: Two times a day (BID) | ORAL | 0 refills | Status: DC

## 2017-03-06 MED ORDER — ONDANSETRON HCL 8 MG PO TABS: 8.0000 mg | ORAL_TABLET | Freq: Three times a day (TID) | ORAL | 0 refills | Status: DC | PRN

## 2017-03-06 NOTE — Progress Notes (Signed)
   Subjective:    Patient ID: Russell Chen, male    DOB: Feb 26, 1971, 46 y.o.   MRN: [id number]  HPINoticed a bull's eye rash on right lower side 2 days ago.  This started after reported tick bite denies fever chills muscle aches sweats chills nausea vomiting diarrhea  He denies headaches fatigue tiredness  The patient relates having some intermittent heel pain when he gets up to walk denies any injury denies swelling or discomfort he asked this more as a question regarding what to do about it.  Review of Systems  Constitutional: Negative for fatigue and fever.  HENT: Negative for congestion.   Cardiovascular: Negative for chest pain.  Gastrointestinal: Negative for abdominal pain.  Musculoskeletal: Negative for myalgias.  Skin: Positive for rash.       Objective:   Physical Exam  Constitutional: He appears well-nourished.  Cardiovascular: Normal rate, regular rhythm and normal heart sounds.   No murmur heard. Pulmonary/Chest: Effort normal and breath sounds normal.  Musculoskeletal: He exhibits no edema.  Lymphadenopathy:    He has no cervical adenopathy.  Neurological: He is alert.  Skin: Rash noted.  Psychiatric: His behavior is normal.  Vitals reviewed.  That area is a small rash that appears to be more of a localized ecchymosis from the tick bite is approximately three fourths of an inch in diameter with central clearing the lateral aspect is now gone       Assessment & Plan:  Tick bite with probable infection doxycycline twice a day for 2 weeks take with a snack tall glass of liquids avoid excessive sunlight follow-up if high fevers body aches or worse  Patient will be [place] he requests Zofran this was given to him  Occasional heel pain his leg was not examined but he was counseled regarding what to do for plantar fascitis follow-up if ongoing troubles

## 2017-03-06 NOTE — Patient Instructions (Addendum)
Plantar Fasciitis Plantar fasciitis is a painful foot condition that affects the heel. It occurs when the band of tissue that connects the toes to the heel bone (plantar fascia) becomes irritated. This can happen after exercising too much or doing other repetitive activities (overuse injury). The pain from plantar fasciitis can range from mild irritation to severe pain that makes it difficult for you to walk or move. The pain is usually worse in the morning or after you have been sitting or lying down for a while. What are the causes? This condition may be caused by:  Standing for long periods of time.  Wearing shoes that do not fit.  Doing high-impact activities, including running, aerobics, and ballet.  Being overweight.  Having an abnormal way of walking (gait).  Having tight calf muscles.  Having high arches in your feet.  Starting a new athletic activity.  What are the signs or symptoms? The main symptom of this condition is heel pain. Other symptoms include:  Pain that gets worse after activity or exercise.  Pain that is worse in the morning or after resting.  Pain that goes away after you walk for a few minutes.  How is this diagnosed? This condition may be diagnosed based on your signs and symptoms. Your health care provider will also do a physical exam to check for:  A tender area on the bottom of your foot.  A high arch in your foot.  Pain when you move your foot.  Difficulty moving your foot.  You may also need to have imaging studies to confirm the diagnosis. These can include:  X-rays.  Ultrasound.  MRI.  How is this treated? Treatment for plantar fasciitis depends on the severity of the condition. Your treatment may include:  Rest, ice, and over-the-counter pain medicines to manage your pain.  Exercises to stretch your calves and your plantar fascia.  A splint that holds your foot in a stretched, upward position while you sleep (night  splint).  Physical therapy to relieve symptoms and prevent problems in the future.  Cortisone injections to relieve severe pain.  Extracorporeal shock wave therapy (ESWT) to stimulate damaged plantar fascia with electrical impulses. It is often used as a last resort before surgery.  Surgery, if other treatments have not worked after 12 months.  Follow these instructions at home:  Take medicines only as directed by your health care provider.  Avoid activities that cause pain.  Roll the bottom of your foot over a bag of ice or a bottle of cold water. Do this for 20 minutes, 3-4 times a day.  Perform simple stretches as directed by your health care provider.  Try wearing athletic shoes with air-sole or gel-sole cushions or soft shoe inserts.  Wear a night splint while sleeping, if directed by your health care provider.  Keep all follow-up appointments with your health care provider. How is this prevented?  Do not perform exercises or activities that cause heel pain.  Consider finding low-impact activities if you continue to have problems.  Lose weight if you need to. The best way to prevent plantar fasciitis is to avoid the activities that aggravate your plantar fascia. Contact a health care provider if:  Your symptoms do not go away after treatment with home care measures.  Your pain gets worse.  Your pain affects your ability to move or do your daily activities. This information is not intended to replace advice given to you by your health care provider. Make sure you   discuss any questions you have with your health care provider. Document Released: 05/31/2001 Document Revised: 02/08/2016 Document Reviewed: 07/16/2014 Elsevier Interactive Patient Education  2018 [place] Information Introduction Ticks are insects that attach themselves to the skin. There are many types of ticks. Common types include wood ticks and deer ticks. Sometimes, ticks carry  diseases that can make a person very ill. The most common places for ticks to attach themselves are the scalp, neck, armpits, waist, and groin. HOW CAN YOU PREVENT TICK BITES? Take these steps to help prevent tick bites when you are outdoors:  Wear long sleeves and long pants.  Wear white clothes so you can see ticks more easily.  Tuck your pant legs into your socks.  If walking on a trail, stay in the middle of the trail to avoid brushing against bushes.  Avoid walking through areas with long grass.  Put bug spray on all skin that is showing and along boot tops, pant legs, and sleeve cuffs.  Check clothes, hair, and skin often and before going inside.  Brush off any ticks that are not attached.  Take a shower or bath as soon as possible after being outdoors.  HOW SHOULD YOU REMOVE A TICK? Ticks should be removed as soon as possible to help prevent diseases. 1. If latex gloves are available, put them on before trying to remove a tick. 2. Use tweezers to grasp the tick as close to the skin as possible. You may also use curved forceps or a tick removal tool. Grasp the tick as close to its head as possible. Avoid grasping the tick on its body. 3. Pull gently upward until the tick lets go. Do not twist the tick or jerk it suddenly. This may break off the tick's head or mouth parts. 4. Do not squeeze or crush the tick's body. This could force disease-carrying fluids from the tick into your body. 5. After the tick is removed, wash the bite area and your hands with soap and water or alcohol. 6. Apply a small amount of antiseptic cream or ointment to the bite site. 7. Wash any tools that were used.  Do not try to remove a tick by applying a hot match, petroleum jelly, or fingernail polish to the tick. These methods do not work. They may also increase the chances of disease being spread from the tick bite. WHEN SHOULD YOU SEEK HELP? Contact your health care provider if you are unable to  remove a tick or if a part of the tick breaks off in the skin. After a tick bite, you need to watch for signs and symptoms of diseases that can be spread by ticks. Contact your health care provider if you develop any of the following:  Fever.  Rash.  Redness and puffiness (swelling) in the area of the tick bite.  Tender, puffy lymph glands.  Watery poop (diarrhea).  Weight loss.  Cough.  Feeling more tired than normal (fatigue).  Muscle, joint, or bone pain.  Belly (abdominal) pain.  Headache.  Change in your level of consciousness.  Trouble walking or moving your legs.  Loss of feeling (numbness) in the legs.  Loss of movement (paralysis).  Shortness of breath.  Confusion.  Throwing up (vomiting) many times.  This information is not intended to replace advice given to you by your health care provider. Make sure you discuss any questions you have with your health care provider. Document Released: 11/30/2009 Document Revised: 02/11/2016 Document Reviewed:  02/13/2013 Elsevier Interactive Patient Education  [employer].

## 2017-06-22 DIAGNOSIS — Z23 Encounter for immunization: Secondary | ICD-10-CM | POA: Diagnosis not present

## 2017-09-29 ENCOUNTER — Encounter: Payer: Self-pay | Admitting: Family Medicine

## 2017-09-29 ENCOUNTER — Ambulatory Visit: Payer: BLUE CROSS/BLUE SHIELD | Admitting: Family Medicine

## 2017-09-29 VITALS — BP 126/88 | Temp 98.2°F | Ht 70.0 in | Wt 179.0 lb

## 2017-09-29 DIAGNOSIS — Z131 Encounter for screening for diabetes mellitus: Secondary | ICD-10-CM

## 2017-09-29 DIAGNOSIS — M545 Low back pain: Secondary | ICD-10-CM | POA: Diagnosis not present

## 2017-09-29 DIAGNOSIS — Z125 Encounter for screening for malignant neoplasm of prostate: Secondary | ICD-10-CM

## 2017-09-29 DIAGNOSIS — Z13220 Encounter for screening for lipoid disorders: Secondary | ICD-10-CM | POA: Diagnosis not present

## 2017-09-29 LAB — POCT URINALYSIS DIPSTICK
Blood, UA: NEGATIVE
Leukocytes, UA: NEGATIVE
PH UA: 6 (ref 5.0–8.0)
Spec Grav, UA: 1.025 (ref 1.010–1.025)

## 2017-09-29 MED ORDER — CYCLOBENZAPRINE HCL 10 MG PO TABS: 10.0000 mg | ORAL_TABLET | Freq: Three times a day (TID) | ORAL | 0 refills | Status: DC | PRN

## 2017-09-29 MED ORDER — DICLOFENAC SODIUM 75 MG PO TBEC: 75.0000 mg | DELAYED_RELEASE_TABLET | Freq: Two times a day (BID) | ORAL | 0 refills | Status: DC

## 2017-09-29 NOTE — Progress Notes (Signed)
   Subjective:    Patient ID: Russell Chen, male    DOB: [birth year], 47 y.o.   MRN: [id number]  HPI  Patient is here today with complaints of  Sudden and severe lower back pain, this started on Friday last week. He has been using Aleve and it has helped some. Really flared up last night. The patient denies any specific injury he did do a long drive as part of a work trip he denies having a history of this before in the past he states is more in the low back on the right side he states there is increased pain with rotation flexion and extension but worse with flexion he is tried OTC measures without much success Review of Systems Results for orders placed or performed in visit on 09/29/17  POCT urinalysis dipstick  Result Value Ref Range   Color, UA     Clarity, UA     Glucose, UA     Bilirubin, UA     Ketones, UA     Spec Grav, UA 1.025 1.010 - 1.025   Blood, UA Negative    pH, UA 6.0 5.0 - 8.0   Protein, UA     Urobilinogen, UA  0.2 or 1.0 E.U./dL   Nitrite, UA     Leukocytes, UA Negative Negative   Appearance     Odor         Objective:   Physical Exam Lungs clear heart regular low back mild tenderness in the right lower spine no skin lesions negative straight leg raise fair range of motion with increased pain with certain movement He does have increased pain with certain movements he cannot flex forward because of increased pain he is able to walk but slowly Negative blood in urine no sign of kidney stone     Assessment & Plan:  Lumbar pain with mild muscle spasms anti-inflammatory muscle relaxer rest stretching exercises follow-up if progressive troubles  Has intermittent chronic low back pain does not wake him up at night he will consider doing x-rays  Wellness exam toward the end of this month lab work indicated  I do not feel he has a kidney stone I do not feel this is a herniated disc certainly if persistent problems testing warranted  Muscle relaxer only when at  home

## 2017-09-29 NOTE — Patient Instructions (Signed)

## 2017-10-11 DIAGNOSIS — Z125 Encounter for screening for malignant neoplasm of prostate: Secondary | ICD-10-CM | POA: Diagnosis not present

## 2017-10-11 DIAGNOSIS — M545 Low back pain: Secondary | ICD-10-CM | POA: Diagnosis not present

## 2017-10-11 DIAGNOSIS — Z131 Encounter for screening for diabetes mellitus: Secondary | ICD-10-CM | POA: Diagnosis not present

## 2017-10-11 DIAGNOSIS — Z13220 Encounter for screening for lipoid disorders: Secondary | ICD-10-CM | POA: Diagnosis not present

## 2017-10-12 LAB — LIPID PANEL
CHOLESTEROL TOTAL: 209 mg/dL — AB (ref 100–199)
Chol/HDL Ratio: 4.2 ratio (ref 0.0–5.0)
HDL: 50 mg/dL (ref >39)
LDL Calculated: 144 mg/dL — ABNORMAL HIGH (ref 0–99)
Triglycerides: 75 mg/dL (ref 0–149)
VLDL Cholesterol Cal: 15 mg/dL (ref 5–40)

## 2017-10-12 LAB — HEPATIC FUNCTION PANEL
ALK PHOS: 59 IU/L (ref 39–117)
ALT: 36 IU/L (ref 0–44)
AST: 24 IU/L (ref 0–40)
Albumin: 5.1 g/dL (ref 3.5–5.5)
BILIRUBIN, DIRECT: 0.18 mg/dL (ref 0.00–0.40)
Bilirubin Total: 0.9 mg/dL (ref 0.0–1.2)
Total Protein: 7.4 g/dL (ref 6.0–8.5)

## 2017-10-12 LAB — BASIC METABOLIC PANEL
BUN / CREAT RATIO: 13 (ref 9–20)
BUN: 14 mg/dL (ref 6–24)
CHLORIDE: 103 mmol/L (ref 96–106)
CO2: 24 mmol/L (ref 20–29)
CREATININE: 1.05 mg/dL (ref 0.76–1.27)
Calcium: 9.7 mg/dL (ref 8.7–10.2)
GFR calc Af Amer: 98 mL/min/{1.73_m2} (ref >59)
GFR calc non Af Amer: 85 mL/min/{1.73_m2} (ref >59)
GLUCOSE: 95 mg/dL (ref 65–99)
Potassium: 4.6 mmol/L (ref 3.5–5.2)
SODIUM: 145 mmol/L — AB (ref 134–144)

## 2017-10-12 LAB — SEDIMENTATION RATE: Sed Rate: 2 mm/hr (ref 0–15)

## 2017-10-12 LAB — PSA: PROSTATE SPECIFIC AG, SERUM: 1.1 ng/mL (ref 0.0–4.0)

## 2017-10-17 ENCOUNTER — Ambulatory Visit (INDEPENDENT_AMBULATORY_CARE_PROVIDER_SITE_OTHER): Payer: BLUE CROSS/BLUE SHIELD | Admitting: Family Medicine

## 2017-10-17 ENCOUNTER — Encounter: Payer: Self-pay | Admitting: Family Medicine

## 2017-10-17 VITALS — BP 110/78 | Ht 70.0 in | Wt 185.2 lb

## 2017-10-17 DIAGNOSIS — Z0000 Encounter for general adult medical examination without abnormal findings: Secondary | ICD-10-CM | POA: Diagnosis not present

## 2017-10-17 NOTE — Progress Notes (Signed)
   Subjective:    Patient ID: Russell Chen, male    DOB: [birth year], 47 y.o.   MRN: [id number]  HPI The p low low backatient comes in today for a wellness visit. Patient states he tries eat well he does not exercise on a regular basis stress levels are good sleeps okay denies hematuria no chest tightness pressure pain denies being depressed   A review of their health history was completed.  A review of medications was also completed.  Any needed refills; none  Eating habits: good  Falls/  MVA accidents in past few months: no  Regular exercise: yes  Specialist pt sees on regular basis: none Preventative health issues were discussed.   Additional concerns: follow up on back from a few weeks ago   Review of Systems  Constitutional: Negative for activity change, appetite change and fever.  HENT: Negative for congestion and rhinorrhea.   Eyes: Negative for discharge.  Respiratory: Negative for cough and wheezing.   Cardiovascular: Negative for chest pain.  Gastrointestinal: Negative for abdominal pain, blood in stool and vomiting.  Genitourinary: Negative for difficulty urinating and frequency.  Musculoskeletal: Negative for neck pain.  Skin: Negative for rash.  Allergic/Immunologic: Negative for environmental allergies and food allergies.  Neurological: Negative for weakness and headaches.  Psychiatric/Behavioral: Negative for agitation.       Objective:   Physical Exam  Constitutional: He appears well-developed and well-nourished.  HENT:  Head: Normocephalic and atraumatic.  Right Ear: External ear normal.  Left Ear: External ear normal.  Nose: Nose normal.  Mouth/Throat: Oropharynx is clear and moist.  Eyes: Right eye exhibits no discharge. Left eye exhibits no discharge. No scleral icterus.  Neck: Normal range of motion. Neck supple. No thyromegaly present.  Cardiovascular: Normal rate, regular rhythm and normal heart sounds.  No murmur heard. Pulmonary/Chest:  Effort normal and breath sounds normal. No respiratory distress. He has no wheezes.  Abdominal: Soft. Bowel sounds are normal. He exhibits no distension and no mass. There is no tenderness.  Genitourinary: Penis normal.  Musculoskeletal: Normal range of motion. He exhibits no edema.  Lymphadenopathy:    He has no cervical adenopathy.  Neurological: He is alert. He exhibits normal muscle tone. Coordination normal.  Skin: Skin is warm and dry. No erythema.  Psychiatric: He has a normal mood and affect. His behavior is normal. Judgment normal.          Assessment & Plan:  Low back patient overall doing fairly well he is to do the regular exercises as discussed he is to follow-up if ongoing troubles no need for any type of advanced imaging  Adult wellness-complete.wellness physical was conducted today. Importance of diet and exercise were discussed in detail. In addition to this a discussion regarding safety was also covered. We also reviewed over immunizations and gave recommendations regarding current immunization needed for age. In addition to this additional areas were also touched on including: Preventative health exams needed: Colonoscopy not indicated  Patient was advised yearly wellness exam

## 2018-02-15 DIAGNOSIS — L814 Other melanin hyperpigmentation: Secondary | ICD-10-CM | POA: Diagnosis not present

## 2018-02-15 DIAGNOSIS — D229 Melanocytic nevi, unspecified: Secondary | ICD-10-CM | POA: Diagnosis not present

## 2018-06-07 DIAGNOSIS — H524 Presbyopia: Secondary | ICD-10-CM | POA: Diagnosis not present

## 2018-06-07 DIAGNOSIS — H52223 Regular astigmatism, bilateral: Secondary | ICD-10-CM | POA: Diagnosis not present

## 2018-06-07 DIAGNOSIS — H5212 Myopia, left eye: Secondary | ICD-10-CM | POA: Diagnosis not present

## 2018-06-28 DIAGNOSIS — Z23 Encounter for immunization: Secondary | ICD-10-CM | POA: Diagnosis not present

## 2018-08-10 ENCOUNTER — Other Ambulatory Visit: Payer: Self-pay | Admitting: Family Medicine

## 2018-08-10 ENCOUNTER — Telehealth: Payer: Self-pay | Admitting: Family Medicine

## 2018-08-10 DIAGNOSIS — R5383 Other fatigue: Secondary | ICD-10-CM

## 2018-08-10 DIAGNOSIS — Z13220 Encounter for screening for lipoid disorders: Secondary | ICD-10-CM

## 2018-08-10 DIAGNOSIS — Z0000 Encounter for general adult medical examination without abnormal findings: Secondary | ICD-10-CM

## 2018-08-10 DIAGNOSIS — Z125 Encounter for screening for malignant neoplasm of prostate: Secondary | ICD-10-CM

## 2018-08-10 NOTE — Telephone Encounter (Signed)
Lipid, liver, metabolic 7, PSA 

## 2018-08-10 NOTE — Telephone Encounter (Signed)
Pt has CPE scheduled for 10/23/18. He would like to have his lab work ordered so he can have it done before appt.   [phone]# [phone]

## 2018-08-10 NOTE — Telephone Encounter (Signed)
Last labs on 10/11/17 were sed rate, psa, bmp, hepatic, lipid. Please advise. Thank you

## 2018-08-10 NOTE — Telephone Encounter (Signed)
Labs placed and patient is aware. Will mail lab orders to patient as a reminder.

## 2018-10-16 DIAGNOSIS — Z13220 Encounter for screening for lipoid disorders: Secondary | ICD-10-CM | POA: Diagnosis not present

## 2018-10-16 DIAGNOSIS — Z0000 Encounter for general adult medical examination without abnormal findings: Secondary | ICD-10-CM | POA: Diagnosis not present

## 2018-10-16 DIAGNOSIS — R5383 Other fatigue: Secondary | ICD-10-CM | POA: Diagnosis not present

## 2018-10-16 DIAGNOSIS — Z125 Encounter for screening for malignant neoplasm of prostate: Secondary | ICD-10-CM | POA: Diagnosis not present

## 2018-10-17 ENCOUNTER — Encounter: Payer: Self-pay | Admitting: Family Medicine

## 2018-10-17 LAB — HEPATIC FUNCTION PANEL
ALBUMIN: 4.7 g/dL (ref 4.0–5.0)
ALK PHOS: 51 IU/L (ref 39–117)
ALT: 20 IU/L (ref 0–44)
AST: 21 IU/L (ref 0–40)
BILIRUBIN, DIRECT: 0.12 mg/dL (ref 0.00–0.40)
Bilirubin Total: 0.5 mg/dL (ref 0.0–1.2)
TOTAL PROTEIN: 6.4 g/dL (ref 6.0–8.5)

## 2018-10-17 LAB — BASIC METABOLIC PANEL
BUN/Creatinine Ratio: 15 (ref 9–20)
BUN: 16 mg/dL (ref 6–24)
CO2: 26 mmol/L (ref 20–29)
CREATININE: 1.1 mg/dL (ref 0.76–1.27)
Calcium: 9.3 mg/dL (ref 8.7–10.2)
Chloride: 107 mmol/L — ABNORMAL HIGH (ref 96–106)
GFR calc Af Amer: 92 mL/min/{1.73_m2} (ref >59)
GFR, EST NON AFRICAN AMERICAN: 80 mL/min/{1.73_m2} (ref >59)
Glucose: 93 mg/dL (ref 65–99)
Potassium: 4.7 mmol/L (ref 3.5–5.2)
Sodium: 147 mmol/L — ABNORMAL HIGH (ref 134–144)

## 2018-10-17 LAB — LIPID PANEL
CHOLESTEROL TOTAL: 181 mg/dL (ref 100–199)
Chol/HDL Ratio: 3.9 ratio (ref 0.0–5.0)
HDL: 47 mg/dL (ref >39)
LDL CALC: 123 mg/dL — AB (ref 0–99)
Triglycerides: 55 mg/dL (ref 0–149)
VLDL Cholesterol Cal: 11 mg/dL (ref 5–40)

## 2018-10-17 LAB — PSA: Prostate Specific Ag, Serum: 0.5 ng/mL (ref 0.0–4.0)

## 2018-10-23 ENCOUNTER — Encounter: Payer: Self-pay | Admitting: Family Medicine

## 2018-10-23 ENCOUNTER — Ambulatory Visit (INDEPENDENT_AMBULATORY_CARE_PROVIDER_SITE_OTHER): Payer: BLUE CROSS/BLUE SHIELD | Admitting: Family Medicine

## 2018-10-23 VITALS — BP 102/80 | Ht 70.0 in | Wt 185.0 lb

## 2018-10-23 DIAGNOSIS — Z0000 Encounter for general adult medical examination without abnormal findings: Secondary | ICD-10-CM | POA: Diagnosis not present

## 2018-10-23 DIAGNOSIS — R351 Nocturia: Secondary | ICD-10-CM

## 2018-10-23 LAB — POCT URINALYSIS DIPSTICK
Blood, UA: NEGATIVE
LEUKOCYTES UA: NEGATIVE
PH UA: 7 (ref 5.0–8.0)
Spec Grav, UA: 1.02 (ref 1.010–1.025)

## 2018-10-23 NOTE — Progress Notes (Signed)
   Subjective:    Patient ID: Russell Chen, male    DOB: 05/02/1971, 49 y.o.   MRN: [id number]  HPI  The patient comes in today for a wellness visit.  Overall patient is doing a better job watching diet exercise and staying healthy denies being depressed  A review of their health history was completed.  A review of medications was also completed.  Any needed refills; No  Eating habits:Good  Falls/  MVA accidents in past few months: No  Regular exercise: Yes  Specialist pt sees on regular basis: No  Preventative health issues were discussed.   Additional concerns: No   Review of Systems  Constitutional: Negative for activity change, fatigue and fever.  HENT: Negative for congestion and rhinorrhea.   Respiratory: Negative for cough and shortness of breath.   Cardiovascular: Negative for chest pain and leg swelling.  Gastrointestinal: Negative for abdominal pain, diarrhea and nausea.  Genitourinary: Negative for dysuria and hematuria.  Neurological: Negative for weakness and headaches.  Psychiatric/Behavioral: Negative for agitation and behavioral problems.       Objective:   Physical Exam Vitals signs reviewed.  Constitutional:      General: He is not in acute distress. HENT:     Head: Normocephalic and atraumatic.  Eyes:     General:        Right eye: No discharge.        Left eye: No discharge.  Neck:     Trachea: No tracheal deviation.  Cardiovascular:     Rate and Rhythm: Normal rate and regular rhythm.     Heart sounds: Normal heart sounds. No murmur.  Pulmonary:     Effort: Pulmonary effort is normal. No respiratory distress.     Breath sounds: Normal breath sounds.  Lymphadenopathy:     Cervical: No cervical adenopathy.  Skin:    General: Skin is warm and dry.  Neurological:     Mental Status: He is alert.     Coordination: Coordination normal.  Psychiatric:        Behavior: Behavior normal.    Prostate not indicated Low back mild  tenderness subjectively intermittently but currently on today's exam is good negative straight leg raise has tight hamstrings would be important for the patient to get that under better control by stretching his hamstrings  Patient states occasionally feels like he has to urinate quickly occasionally has to get up at night therefore we ordered to your no sugar in it no protein in it no red blood cells or white blood cells if it becomes ongoing or progressive problems he will need more thorough work-up he understands this     Assessment & Plan:  Adult wellness-complete.wellness physical was conducted today. Importance of diet and exercise were discussed in detail.  In addition to this a discussion regarding safety was also covered. We also reviewed over immunizations and gave recommendations regarding current immunization needed for age.  In addition to this additional areas were also touched on including: Preventative health exams needed:  Colonoscopy not indicated till age 32 Prostate exam not indicated at age 57  Patient was advised yearly wellness exam  Lab work reviewed with the patient risk of heart disease 2% Healthy diet regular physical activity repeat lab work again in 1 year  Intermittent low back pain with certain activities recommend stretching exercises core exercises examination benign

## 2019-07-05 DIAGNOSIS — Z23 Encounter for immunization: Secondary | ICD-10-CM | POA: Diagnosis not present

## 2019-10-03 ENCOUNTER — Telehealth: Payer: Self-pay | Admitting: Family Medicine

## 2019-10-03 DIAGNOSIS — Z131 Encounter for screening for diabetes mellitus: Secondary | ICD-10-CM

## 2019-10-03 DIAGNOSIS — Z125 Encounter for screening for malignant neoplasm of prostate: Secondary | ICD-10-CM

## 2019-10-03 DIAGNOSIS — Z13220 Encounter for screening for lipoid disorders: Secondary | ICD-10-CM

## 2019-10-03 DIAGNOSIS — R5383 Other fatigue: Secondary | ICD-10-CM

## 2019-10-03 NOTE — Telephone Encounter (Signed)
Last labs 10/08/18 lipid, liver, bmp, psa

## 2019-10-03 NOTE — Telephone Encounter (Signed)
Pt has CPE on 2/11 and would like lab work done

## 2019-10-03 NOTE — Telephone Encounter (Signed)
Lipid, CMP, CBC, PSA

## 2019-10-04 NOTE — Telephone Encounter (Signed)
Blood work ordered in Epic. Left message to return call to notify patient. 

## 2019-10-09 NOTE — Telephone Encounter (Signed)
Patient notified

## 2019-10-17 DIAGNOSIS — Z125 Encounter for screening for malignant neoplasm of prostate: Secondary | ICD-10-CM | POA: Diagnosis not present

## 2019-10-17 DIAGNOSIS — R5383 Other fatigue: Secondary | ICD-10-CM | POA: Diagnosis not present

## 2019-10-17 DIAGNOSIS — Z13220 Encounter for screening for lipoid disorders: Secondary | ICD-10-CM | POA: Diagnosis not present

## 2019-10-17 DIAGNOSIS — Z131 Encounter for screening for diabetes mellitus: Secondary | ICD-10-CM | POA: Diagnosis not present

## 2019-10-18 LAB — CBC WITH DIFFERENTIAL/PLATELET
Basophils Absolute: 0.1 10*3/uL (ref 0.0–0.2)
Basos: 2 %
EOS (ABSOLUTE): 0.2 10*3/uL (ref 0.0–0.4)
Eos: 5 %
Hematocrit: 40.9 % (ref 37.5–51.0)
Hemoglobin: 13.9 g/dL (ref 13.0–17.7)
Immature Grans (Abs): 0 10*3/uL (ref 0.0–0.1)
Immature Granulocytes: 0 %
Lymphocytes Absolute: 1.2 10*3/uL (ref 0.7–3.1)
Lymphs: 28 %
MCH: 28.2 pg (ref 26.6–33.0)
MCHC: 34 g/dL (ref 31.5–35.7)
MCV: 83 fL (ref 79–97)
Monocytes Absolute: 0.5 10*3/uL (ref 0.1–0.9)
Monocytes: 11 %
Neutrophils Absolute: 2.2 10*3/uL (ref 1.4–7.0)
Neutrophils: 54 %
Platelets: 189 10*3/uL (ref 150–450)
RBC: 4.93 x10E6/uL (ref 4.14–5.80)
RDW: 13.6 % (ref 11.6–15.4)
WBC: 4.2 10*3/uL (ref 3.4–10.8)

## 2019-10-18 LAB — LIPID PANEL
Chol/HDL Ratio: 3.7 ratio (ref 0.0–5.0)
Cholesterol, Total: 179 mg/dL (ref 100–199)
HDL: 49 mg/dL (ref >39)
LDL Chol Calc (NIH): 119 mg/dL — ABNORMAL HIGH (ref 0–99)
Triglycerides: 57 mg/dL (ref 0–149)
VLDL Cholesterol Cal: 11 mg/dL (ref 5–40)

## 2019-10-18 LAB — COMPREHENSIVE METABOLIC PANEL
ALT: 18 IU/L (ref 0–44)
AST: 18 IU/L (ref 0–40)
Albumin/Globulin Ratio: 2.3 — ABNORMAL HIGH (ref 1.2–2.2)
Albumin: 4.3 g/dL (ref 4.0–5.0)
Alkaline Phosphatase: 62 IU/L (ref 39–117)
BUN/Creatinine Ratio: 14 (ref 9–20)
BUN: 16 mg/dL (ref 6–24)
Bilirubin Total: 0.4 mg/dL (ref 0.0–1.2)
CO2: 28 mmol/L (ref 20–29)
Calcium: 9.4 mg/dL (ref 8.7–10.2)
Chloride: 107 mmol/L — ABNORMAL HIGH (ref 96–106)
Creatinine, Ser: 1.11 mg/dL (ref 0.76–1.27)
GFR calc Af Amer: 90 mL/min/{1.73_m2} (ref >59)
GFR calc non Af Amer: 78 mL/min/{1.73_m2} (ref >59)
Globulin, Total: 1.9 g/dL (ref 1.5–4.5)
Glucose: 89 mg/dL (ref 65–99)
Potassium: 5.1 mmol/L (ref 3.5–5.2)
Sodium: 145 mmol/L — ABNORMAL HIGH (ref 134–144)
Total Protein: 6.2 g/dL (ref 6.0–8.5)

## 2019-10-18 LAB — PSA: Prostate Specific Ag, Serum: 0.6 ng/mL (ref 0.0–4.0)

## 2019-10-31 ENCOUNTER — Encounter: Payer: Self-pay | Admitting: Family Medicine

## 2019-10-31 ENCOUNTER — Ambulatory Visit (INDEPENDENT_AMBULATORY_CARE_PROVIDER_SITE_OTHER): Payer: BC Managed Care – PPO | Admitting: Family Medicine

## 2019-10-31 ENCOUNTER — Other Ambulatory Visit: Payer: Self-pay

## 2019-10-31 VITALS — BP 112/78 | Ht 69.75 in | Wt 186.4 lb

## 2019-10-31 DIAGNOSIS — Z0000 Encounter for general adult medical examination without abnormal findings: Secondary | ICD-10-CM

## 2019-10-31 NOTE — Progress Notes (Signed)
Subjective:    Patient ID: Russell Chen, male    DOB: [birth year], 49 y.o.   MRN: [id number]  HPI The patient comes in today for a wellness visit. Patient is trying to eat healthy Exercises on a regular basis Does eat out some but tries to avoid fried food fatty food.  Denies any type of chest tightness pressure pain or shortness of breath states his moods are doing good under a new job doing well with it.   A review of their health history was completed.  A review of medications was also completed.  Any needed refills; not on any meds  Eating habits: health conscious  Falls/  MVA accidents in past few months: none  Regular exercise: runs  Specialist pt sees on regular basis: none  Preventative health issues were discussed.   Additional concerns: none    Review of Systems  Constitutional: Negative for diaphoresis and fatigue.  HENT: Negative for congestion and rhinorrhea.   Respiratory: Negative for cough and shortness of breath.   Cardiovascular: Negative for chest pain and leg swelling.  Gastrointestinal: Negative for abdominal pain and diarrhea.  Skin: Negative for color change and rash.  Neurological: Negative for dizziness and headaches.  Psychiatric/Behavioral: Negative for behavioral problems and confusion.       Objective:   Physical Exam Constitutional:      Appearance: He is well-developed.  HENT:     Head: Normocephalic and atraumatic.     Right Ear: External ear normal.     Left Ear: External ear normal.     Nose: Nose normal.  Eyes:     Pupils: Pupils are equal, round, and reactive to light.  Neck:     Thyroid: No thyromegaly.  Cardiovascular:     Rate and Rhythm: Normal rate and regular rhythm.     Heart sounds: Normal heart sounds. No murmur.  Pulmonary:     Effort: Pulmonary effort is normal. No respiratory distress.     Breath sounds: Normal breath sounds. No wheezing.  Abdominal:     General: Bowel sounds are normal. There is no  distension.     Palpations: Abdomen is soft. There is no mass.     Tenderness: There is no abdominal tenderness.  Genitourinary:    Penis: Normal.   Musculoskeletal:        General: Normal range of motion.     Cervical back: Normal range of motion and neck supple.  Lymphadenopathy:     Cervical: No cervical adenopathy.  Skin:    General: Skin is warm and dry.     Findings: No erythema.  Neurological:     Mental Status: He is alert.     Motor: No abnormal muscle tone.  Psychiatric:        Behavior: Behavior normal.        Judgment: Judgment normal.    GU normal prostate not indicated Results for orders placed or performed in visit on 10/03/19  Lipid Profile  Result Value Ref Range   Cholesterol, Total 179 100 - 199 mg/dL   Triglycerides 57 0 - 149 mg/dL   HDL 49 >39 mg/dL   VLDL Cholesterol Cal 11 5 - 40 mg/dL   LDL Chol Calc (NIH) 119 (H) 0 - 99 mg/dL   Chol/HDL Ratio 3.7 0.0 - 5.0 ratio  Comprehensive metabolic panel  Result Value Ref Range   Glucose 89 65 - 99 mg/dL   BUN 16 6 - 24 mg/dL   Creatinine, Ser  1.11 0.76 - 1.27 mg/dL   GFR calc non Af Amer 78 >59 mL/min/1.73   GFR calc Af Amer 90 >59 mL/min/1.73   BUN/Creatinine Ratio 14 9 - 20   Sodium 145 (H) 134 - 144 mmol/L   Potassium 5.1 3.5 - 5.2 mmol/L   Chloride 107 (H) 96 - 106 mmol/L   CO2 28 20 - 29 mmol/L   Calcium 9.4 8.7 - 10.2 mg/dL   Total Protein 6.2 6.0 - 8.5 g/dL   Albumin 4.3 4.0 - 5.0 g/dL   Globulin, Total 1.9 1.5 - 4.5 g/dL   Albumin/Globulin Ratio 2.3 (H) 1.2 - 2.2   Bilirubin Total 0.4 0.0 - 1.2 mg/dL   Alkaline Phosphatase 62 39 - 117 IU/L   AST 18 0 - 40 IU/L   ALT 18 0 - 44 IU/L  CBC with Diff  Result Value Ref Range   WBC 4.2 3.4 - 10.8 x10E3/uL   RBC 4.93 4.14 - 5.80 x10E6/uL   Hemoglobin 13.9 13.0 - 17.7 g/dL   Hematocrit 40.9 37.5 - 51.0 %   MCV 83 79 - 97 fL   MCH 28.2 26.6 - 33.0 pg   MCHC 34.0 31.5 - 35.7 g/dL   RDW 13.6 11.6 - 15.4 %   Platelets 189 150 - 450 x10E3/uL    Neutrophils 54 Not Estab. %   Lymphs 28 Not Estab. %   Monocytes 11 Not Estab. %   Eos 5 Not Estab. %   Basos 2 Not Estab. %   Neutrophils Absolute 2.2 1.4 - 7.0 x10E3/uL   Lymphocytes Absolute 1.2 0.7 - 3.1 x10E3/uL   Monocytes Absolute 0.5 0.1 - 0.9 x10E3/uL   EOS (ABSOLUTE) 0.2 0.0 - 0.4 x10E3/uL   Basophils Absolute 0.1 0.0 - 0.2 x10E3/uL   Immature Granulocytes 0 Not Estab. %   Immature Grans (Abs) 0.0 0.0 - 0.1 x10E3/uL  PSA  Result Value Ref Range   Prostate Specific Ag, Serum 0.6 0.0 - 4.0 ng/mL         Assessment & Plan:  Cholesterol shows slight elevation in the bad cholesterol but not enough to be on medication overall cardiac risk is low Doing a good job watching diet and staying physically active Safety was reviewed in detail Lab work including kidney function liver function CBC and PSA look good Sodium minimally elevated avoid excessive salt Adult wellness-complete.wellness physical was conducted today. Importance of diet and exercise were discussed in detail.  In addition to this a discussion regarding safety was also covered. We also reviewed over immunizations and gave recommendations regarding current immunization needed for age.  In addition to this additional areas were also touched on including: Preventative health exams needed:  Colonoscopy not indicated currently in start at age 67  Patient was advised yearly wellness exam

## 2020-02-21 ENCOUNTER — Encounter: Payer: Self-pay | Admitting: Nurse Practitioner

## 2020-02-21 ENCOUNTER — Other Ambulatory Visit: Payer: Self-pay

## 2020-02-21 ENCOUNTER — Ambulatory Visit: Payer: BC Managed Care – PPO | Admitting: Nurse Practitioner

## 2020-02-21 VITALS — BP 118/88 | Temp 97.7°F | Wt 187.4 lb

## 2020-02-21 DIAGNOSIS — H6121 Impacted cerumen, right ear: Secondary | ICD-10-CM

## 2020-02-21 NOTE — Patient Instructions (Signed)
Hydrogen peroxide mix equal parts with warm water Irrigate ear then put in cotton ball for about 10-20 min; then remove Nasacort AQ or Flonase use as directed

## 2020-02-21 NOTE — Progress Notes (Signed)
   Subjective:    Patient ID: Russell Chen, male    DOB: Jun 02, 1971, 49 y.o.   MRN: [id number]  HPI Pt here today for ear stopped up. Having wax buildup. Had wax buildup when he saw Dr.Scott in Feb, but was not bothering him at that time. Pt went swimming and it felt as if the water would not come out. Right ear affected. No pain or drainage. Has tried to use Q tip.  No fever.  No drainage from the ear.  No head congestion or runny nose.  Review of Systems     Objective:   Physical Exam NAD.  Alert, oriented.  Left TM mildly retracted, no erythema.  Right TM mostly obscured with light-colored cerumen, the portion of the TM visualized has no erythema.  Unable to remove cerumen due to tenderness.  No tenderness noted with movement of the pinna or tragus.  Pharynx clear.  Neck supple with mild soft anterior adenopathy.  Lungs clear.  Heart regular rate rhythm.       Assessment & Plan:  Impacted cerumen of right ear  Possible element of rhinitis Hydrogen peroxide mix equal parts with warm water Irrigate ear then put in cotton ball for about 10-20 min; then remove Nasacort AQ or Flonase use as directed Call back in 7 to 10 days if no improvement, sooner if worse.

## 2020-02-22 ENCOUNTER — Encounter: Payer: Self-pay | Admitting: Nurse Practitioner

## 2020-05-28 ENCOUNTER — Telehealth: Payer: Self-pay | Admitting: Family Medicine

## 2020-05-28 ENCOUNTER — Other Ambulatory Visit: Payer: Self-pay

## 2020-05-28 ENCOUNTER — Ambulatory Visit
Admission: EM | Admit: 2020-05-28 | Discharge: 2020-05-28 | Disposition: A | Discharge status: Home / Self Care | Payer: BC Managed Care – PPO | Attending: Emergency Medicine | Admitting: Emergency Medicine

## 2020-05-28 DIAGNOSIS — Z1152 Encounter for screening for COVID-19: Secondary | ICD-10-CM

## 2020-05-28 NOTE — Telephone Encounter (Signed)
Patient left voice mail that he has lost taste/smell and wanting to know what should he do. Please advise

## 2020-05-31 ENCOUNTER — Encounter: Payer: Self-pay | Admitting: Family Medicine

## 2020-05-31 LAB — NOVEL CORONAVIRUS, NAA: SARS-CoV-2, NAA: DETECTED — AB

## 2020-06-01 ENCOUNTER — Telehealth (HOSPITAL_COMMUNITY): Payer: Self-pay | Admitting: Adult Health

## 2020-06-01 ENCOUNTER — Telehealth: Payer: Self-pay | Admitting: *Deleted

## 2020-06-01 NOTE — Telephone Encounter (Signed)
Nurses Connect with patient please, certainly it is no fun to get sick, hopefully his case will stay mild  It would be advisable for patient to do a virtual visit this week. As for his household members who are living in the household should also be under quarantine for 14 days So in other words members of the household should not be going to school currently  Certainly if the patient starts developing chest tightness pressure pain shortness of breath passing out these would all be indications to be seen ASAP through urgent care or ER or through [place] depending on the situation  The patient himself will need to be on self isolation for 10 days minimum from the first day of the symptoms  Please set up for virtual visit for this week Thanks-Dr. Nicki Reaper

## 2020-06-01 NOTE — Telephone Encounter (Signed)
Called patient regarding monoclonal antibody treatment for COVID 19 given to those who are at risk for complications and/or hospitalization of the virus.  Patient meets criteria based on: BMI greater than 25.  His symptom onset was on 05/27/2020. He notes he is feeling improved and does not want monoclonal antibody therapy.    Call back number given: [phone]  My chart message: n/a  Wilber Bihari, NP

## 2020-06-01 NOTE — Telephone Encounter (Signed)
Pt called in after getting mychart message. He did make virtual appt for this week but he wanted to let dr scott know he went online to [employer] website and under took kit he read the regulations and states since he has kept 6 foot away from son and uses separate bathroom and his son has shown no symptoms he sent him to school.

## 2020-06-03 ENCOUNTER — Other Ambulatory Visit: Payer: Self-pay

## 2020-06-03 ENCOUNTER — Telehealth: Payer: Self-pay | Admitting: *Deleted

## 2020-06-03 ENCOUNTER — Telehealth (INDEPENDENT_AMBULATORY_CARE_PROVIDER_SITE_OTHER): Payer: BC Managed Care – PPO | Admitting: Family Medicine

## 2020-06-03 ENCOUNTER — Encounter: Payer: Self-pay | Admitting: Family Medicine

## 2020-06-03 ENCOUNTER — Telehealth: Payer: Self-pay | Admitting: Family Medicine

## 2020-06-03 DIAGNOSIS — R350 Frequency of micturition: Secondary | ICD-10-CM

## 2020-06-03 DIAGNOSIS — R3912 Poor urinary stream: Secondary | ICD-10-CM

## 2020-06-03 DIAGNOSIS — U071 COVID-19: Secondary | ICD-10-CM | POA: Diagnosis not present

## 2020-06-03 NOTE — Progress Notes (Signed)
   Subjective:    Patient ID: Russell Chen, male    DOB: 07-26-1971, 49 y.o.   MRN: [id number]  HPI  Very nice patient Unfortunately came down with Covid recently Not having any breathing difficulties no vomiting or diarrhea.  Starting to improve some Was offered monoclonal antibody but patient deferring currently With him doing significantly better and not having any major symptoms I agree with him but patient was told that should he start having chest discomforts shortness of breath fevers chills and if he is within 10 days of initial illness he could consider doing a monoclonal antibody.  Patient was also told that monoclonal antibody can reduce risk of hospitalization Pt tested positive for COVID on 05/28/20. Pt began with sinus pressure and loss of smell/taste. Now pt states that he is getting his taste back some. Smell gone still.   Virtual Visit via Video Note  I connected with Russell Chen on 06/03/20 at  2:00 PM EDT by a video enabled telemedicine application and verified that I am speaking with the correct person using two identifiers.  Location: Patient: home Provider: office   I discussed the limitations of evaluation and management by telemedicine and the availability of in person appointments. The patient expressed understanding and agreed to proceed.  History of Present Illness:    Observations/Objective:   Assessment and Plan:   Follow Up Instructions:    I discussed the assessment and treatment plan with the patient. The patient was provided an opportunity to ask questions and all were answered. The patient agreed with the plan and demonstrated an understanding of the instructions.   The patient was advised to call back or seek an in-person evaluation if the symptoms worsen or if the condition fails to improve as anticipated.  I provided 20 minutes of non-face-to-face time during this encounter.       Review of Systems See above    Objective:    Physical Exam  Patient had virtual visit Appears to be in no distress Atraumatic Neuro able to relate and oriented No apparent resp distress Color normal       Assessment & Plan:  Self-isolation 10 days recommended May return to work on Monday Letter will be given Family member should be under quarantine for 14 days Rationale discussed warning signs discussed. Patient has had vaccine

## 2020-06-03 NOTE — Telephone Encounter (Signed)
Talked with lady at the health department and she states pt's son should not have went to school unless he had a covid vaccine. We have no record of him getting a vaccine and she looked him up on the cmes system and had no record of him getting a covid vaccine. She states he has to quarantine and not be in school. She asked me which school was he at and I told her I did not know. Dad was having a visit this afternoon and I would let dr scott know this information.

## 2020-06-03 NOTE — Telephone Encounter (Signed)
Per dr Nicki Reaper. Call health department and discuss covid regulations. Pt is positive and sent his child to school because they have kept 6 foot apart. See his mychart message. I called health department and left a message to return call with Abigail Butts.

## 2020-06-03 NOTE — Telephone Encounter (Signed)
Mr. cutberto, winfree are scheduled for a virtual visit with your provider today.    Just as we do with appointments in the office, we must obtain your consent to participate.  Your consent will be active for this visit and any virtual visit you may have with one of our providers in the next 365 days.    If you have a MyChart account, I can also send a copy of this consent to you electronically.  All virtual visits are billed to your insurance company just like a traditional visit in the office.  As this is a virtual visit, video technology does not allow for your provider to perform a traditional examination.  This may limit your provider's ability to fully assess your condition.  If your provider identifies any concerns that need to be evaluated in person or the need to arrange testing such as labs, EKG, etc, we will make arrangements to do so.    Although advances in technology are sophisticated, we cannot ensure that it will always work on either your end or our end.  If the connection with a video visit is poor, we may have to switch to a telephone visit.  With either a video or telephone visit, we are not always able to ensure that we have a secure connection.   I need to obtain your verbal consent now.   Are you willing to proceed with your visit today?   ZACKARI RUANE has provided verbal consent on 06/03/2020 for a virtual visit (video or telephone).   Vicente Males, LPN 01/21/6978  4:80 PM

## 2020-06-04 NOTE — Telephone Encounter (Signed)
The dad was informed of this he will keep the son home for a self quarantine for 14 days

## 2020-06-04 NOTE — Telephone Encounter (Signed)
So in this situation when we called [facility] contact tracing hotline as well as the local health department and asked them there guidelines on this and they recommended 14-day quarantine.  The dad at his virtual visit was told this.

## 2020-06-07 NOTE — Telephone Encounter (Signed)
Nurses Please connect with patient I would like for him to do metabolic 7, PSA Earlier this year his PSA was normal but given his symptoms we will repeat this Diagnosis reduced urinary stream Urinary frequency  I would also like for Shanon Brow to set up a follow-up office visit somewhere in the 2 to 5 weeks at that visit we will do a urinalysis here in the office as well as a prostate exam  Please forward the following communication to Shanon Brow as well Delila Spence More than likely your prostate is mildly enlarged and given you some flow issues.  It is very unlikely that this is cancer.  In some situations medications can be helpful.  I agree with you that you do not consume large amounts of fluids but I would recommend keeping caffeinated liquids to no more than 12 ounces twice daily  Caffeine can stimulate urinary frequency and becomes more pronounced as we get older.  Look forward to seeing you in a few weeks [facility]-Dr. Nicki Reaper

## 2020-06-08 NOTE — Addendum Note (Signed)
Addended by: Vicente Males on: 06/08/2020 08:19 AM   Modules accepted: Orders

## 2020-06-15 DIAGNOSIS — R350 Frequency of micturition: Secondary | ICD-10-CM | POA: Diagnosis not present

## 2020-06-15 DIAGNOSIS — R3912 Poor urinary stream: Secondary | ICD-10-CM | POA: Diagnosis not present

## 2020-06-16 LAB — BASIC METABOLIC PANEL
BUN/Creatinine Ratio: 14 (ref 9–20)
BUN: 15 mg/dL (ref 6–24)
CO2: 26 mmol/L (ref 20–29)
Calcium: 9 mg/dL (ref 8.7–10.2)
Chloride: 107 mmol/L — ABNORMAL HIGH (ref 96–106)
Creatinine, Ser: 1.08 mg/dL (ref 0.76–1.27)
GFR calc Af Amer: 93 mL/min/{1.73_m2} (ref >59)
GFR calc non Af Amer: 80 mL/min/{1.73_m2} (ref >59)
Glucose: 149 mg/dL — ABNORMAL HIGH (ref 65–99)
Potassium: 4.7 mmol/L (ref 3.5–5.2)
Sodium: 145 mmol/L — ABNORMAL HIGH (ref 134–144)

## 2020-06-16 LAB — PSA: Prostate Specific Ag, Serum: 0.8 ng/mL (ref 0.0–4.0)

## 2020-07-15 ENCOUNTER — Other Ambulatory Visit: Payer: Self-pay

## 2020-07-15 ENCOUNTER — Ambulatory Visit (INDEPENDENT_AMBULATORY_CARE_PROVIDER_SITE_OTHER): Payer: BC Managed Care – PPO | Admitting: Family Medicine

## 2020-07-15 VITALS — BP 124/82

## 2020-07-15 DIAGNOSIS — R350 Frequency of micturition: Secondary | ICD-10-CM

## 2020-07-15 DIAGNOSIS — N401 Enlarged prostate with lower urinary tract symptoms: Secondary | ICD-10-CM | POA: Diagnosis not present

## 2020-07-15 MED ORDER — TAMSULOSIN HCL 0.4 MG PO CAPS: ORAL_CAPSULE | ORAL | 3 refills | Status: DC

## 2020-07-15 NOTE — Progress Notes (Signed)
   Subjective:    Patient ID: Russell Chen, male    DOB: March 14, 1971, 49 y.o.   MRN: [id number]   Patient reports urinary frequency, difficulty emptying bladder and decreased urine flow that has occurred gradually over the last 6+ months.  Patient relates that urine flow is not as good as it has been.  Does occasionally have to get up at night to pee.  Finds himself going to the bathroom more frequently. Bladder pain.  Denies hematuria denies rectal bleeding.  States energy level overall doing okay No fever.     Objective:   Physical Exam Lungs clear respiratory rate normal heart regular flanks nontender abdomen soft no masses Prostate exam slight enlargement of the prostate not unusual no pathology noticed        Assessment & Plan:  Mild BPH There are options here Reasonable to start with Flomax each evening Side effects discussed. If any significant side effects or progressive troubles to notify us Recent lab work showed glucose elevated was not fasting Follow-up next week for nurse visit to check her glucose fingerstick fasting Will also have patient submit urine for dipstick and microscopic  Patient will give [place] feedback within 2 to 3 weeks how this is doing for him if progressive symptoms or worsening symptoms urology referral for further evaluation

## 2020-07-16 ENCOUNTER — Other Ambulatory Visit: Payer: BC Managed Care – PPO

## 2020-07-16 NOTE — Progress Notes (Signed)
07/16/20- sent patient a my chart message

## 2020-07-23 ENCOUNTER — Other Ambulatory Visit (INDEPENDENT_AMBULATORY_CARE_PROVIDER_SITE_OTHER): Payer: BC Managed Care – PPO

## 2020-07-23 ENCOUNTER — Other Ambulatory Visit: Payer: Self-pay

## 2020-07-23 DIAGNOSIS — R350 Frequency of micturition: Secondary | ICD-10-CM | POA: Diagnosis not present

## 2020-07-23 DIAGNOSIS — Z131 Encounter for screening for diabetes mellitus: Secondary | ICD-10-CM | POA: Diagnosis not present

## 2020-07-23 LAB — POCT URINALYSIS DIPSTICK
Spec Grav, UA: >=1.03 — AB (ref 1.010–1.025)
pH, UA: 6 (ref 5.0–8.0)

## 2020-07-23 LAB — POCT GLUCOSE (DEVICE FOR HOME USE): Glucose Fasting, POC: 91 mg/dL (ref 70–99)

## 2020-09-27 ENCOUNTER — Encounter: Payer: Self-pay | Admitting: Family Medicine

## 2020-11-12 ENCOUNTER — Encounter: Payer: Self-pay | Admitting: Family Medicine

## 2020-11-12 DIAGNOSIS — Z79899 Other long term (current) drug therapy: Secondary | ICD-10-CM

## 2020-11-12 DIAGNOSIS — Z0000 Encounter for general adult medical examination without abnormal findings: Secondary | ICD-10-CM

## 2020-11-12 DIAGNOSIS — Z13220 Encounter for screening for lipoid disorders: Secondary | ICD-10-CM

## 2020-11-12 DIAGNOSIS — N401 Enlarged prostate with lower urinary tract symptoms: Secondary | ICD-10-CM

## 2020-11-13 MED ORDER — TAMSULOSIN HCL 0.4 MG PO CAPS
ORAL_CAPSULE | ORAL | 5 refills | Status: DC
Start: 2020-11-13 — End: 2021-04-06

## 2020-11-13 NOTE — Telephone Encounter (Signed)
Nurses Please go ahead with refill of tamsulosin with 5 additional refills Also please order Lipid, PSA, CMP for his wellness Send Shad notification of this thank you

## 2020-11-13 NOTE — Addendum Note (Signed)
Addended by: Vicente Males on: 11/13/2020 10:21 AM   Modules accepted: Orders

## 2020-11-16 DIAGNOSIS — Z13220 Encounter for screening for lipoid disorders: Secondary | ICD-10-CM | POA: Diagnosis not present

## 2020-11-16 DIAGNOSIS — Z79899 Other long term (current) drug therapy: Secondary | ICD-10-CM | POA: Diagnosis not present

## 2020-11-16 DIAGNOSIS — R350 Frequency of micturition: Secondary | ICD-10-CM | POA: Diagnosis not present

## 2020-11-16 DIAGNOSIS — N401 Enlarged prostate with lower urinary tract symptoms: Secondary | ICD-10-CM | POA: Diagnosis not present

## 2020-11-17 LAB — COMPREHENSIVE METABOLIC PANEL
ALT: 33 IU/L (ref 0–44)
AST: 30 IU/L (ref 0–40)
Albumin/Globulin Ratio: 2.4 — ABNORMAL HIGH (ref 1.2–2.2)
Albumin: 4.5 g/dL (ref 4.0–5.0)
Alkaline Phosphatase: 60 IU/L (ref 44–121)
BUN/Creatinine Ratio: 19 (ref 9–20)
BUN: 20 mg/dL (ref 6–24)
Bilirubin Total: 0.4 mg/dL (ref 0.0–1.2)
CO2: 24 mmol/L (ref 20–29)
Calcium: 9.3 mg/dL (ref 8.7–10.2)
Chloride: 107 mmol/L — ABNORMAL HIGH (ref 96–106)
Creatinine, Ser: 1.07 mg/dL (ref 0.76–1.27)
Globulin, Total: 1.9 g/dL (ref 1.5–4.5)
Glucose: 95 mg/dL (ref 65–99)
Potassium: 4.6 mmol/L (ref 3.5–5.2)
Sodium: 145 mmol/L — ABNORMAL HIGH (ref 134–144)
Total Protein: 6.4 g/dL (ref 6.0–8.5)
eGFR: 85 mL/min/{1.73_m2} (ref >59)

## 2020-11-17 LAB — LIPID PANEL
Chol/HDL Ratio: 4.1 ratio (ref 0.0–5.0)
Cholesterol, Total: 203 mg/dL — ABNORMAL HIGH (ref 100–199)
HDL: 49 mg/dL (ref >39)
LDL Chol Calc (NIH): 140 mg/dL — ABNORMAL HIGH (ref 0–99)
Triglycerides: 80 mg/dL (ref 0–149)
VLDL Cholesterol Cal: 14 mg/dL (ref 5–40)

## 2020-11-17 LAB — PSA: Prostate Specific Ag, Serum: 0.6 ng/mL (ref 0.0–4.0)

## 2020-12-03 ENCOUNTER — Ambulatory Visit (INDEPENDENT_AMBULATORY_CARE_PROVIDER_SITE_OTHER): Payer: BC Managed Care – PPO | Admitting: Family Medicine

## 2020-12-03 ENCOUNTER — Encounter: Payer: Self-pay | Admitting: Family Medicine

## 2020-12-03 ENCOUNTER — Other Ambulatory Visit: Payer: Self-pay

## 2020-12-03 VITALS — BP 125/80 | HR 69 | Temp 96.6°F | Ht 68.5 in | Wt 190.8 lb

## 2020-12-03 DIAGNOSIS — Z0000 Encounter for general adult medical examination without abnormal findings: Secondary | ICD-10-CM

## 2020-12-03 DIAGNOSIS — Z1211 Encounter for screening for malignant neoplasm of colon: Secondary | ICD-10-CM | POA: Diagnosis not present

## 2020-12-03 NOTE — Patient Instructions (Signed)
We will send referral to GI They will call If you dont hear anything over the next 2 weeks let me know

## 2020-12-03 NOTE — Progress Notes (Signed)
Subjective:    Patient ID: Russell Chen, male    DOB: [birth year], 50 y.o.   MRN: [id number]  HPI The patient comes in today for a wellness visit.    A review of their health history was completed.  A review of medications was also completed.  Any needed refills; none  Eating habits: healthy eating   Falls/  MVA accidents in past few months: none  Regular exercise: outdoor work; some exercise ( stretching/cardio)  Specialist pt sees on regular basis: none  Preventative health issues were discussed.   Additional concerns: none  Results for orders placed or performed in visit on 11/12/20  Lipid Profile  Result Value Ref Range   Cholesterol, Total 203 (H) 100 - 199 mg/dL   Triglycerides 80 0 - 149 mg/dL   HDL 49 >39 mg/dL   VLDL Cholesterol Cal 14 5 - 40 mg/dL   LDL Chol Calc (NIH) 140 (H) 0 - 99 mg/dL   Chol/HDL Ratio 4.1 0.0 - 5.0 ratio  PSA  Result Value Ref Range   Prostate Specific Ag, Serum 0.6 0.0 - 4.0 ng/mL  Comprehensive Metabolic Panel (CMET)  Result Value Ref Range   Glucose 95 65 - 99 mg/dL   BUN 20 6 - 24 mg/dL   Creatinine, Ser 1.07 0.76 - 1.27 mg/dL   eGFR 85 >59 mL/min/1.73   BUN/Creatinine Ratio 19 9 - 20   Sodium 145 (H) 134 - 144 mmol/L   Potassium 4.6 3.5 - 5.2 mmol/L   Chloride 107 (H) 96 - 106 mmol/L   CO2 24 20 - 29 mmol/L   Calcium 9.3 8.7 - 10.2 mg/dL   Total Protein 6.4 6.0 - 8.5 g/dL   Albumin 4.5 4.0 - 5.0 g/dL   Globulin, Total 1.9 1.5 - 4.5 g/dL   Albumin/Globulin Ratio 2.4 (H) 1.2 - 2.2   Bilirubin Total 0.4 0.0 - 1.2 mg/dL   Alkaline Phosphatase 60 44 - 121 IU/L   AST 30 0 - 40 IU/L   ALT 33 0 - 44 IU/L     Review of Systems  Constitutional: Negative for activity change, appetite change and fever.  HENT: Negative for congestion and rhinorrhea.   Eyes: Negative for discharge.  Respiratory: Negative for cough and wheezing.   Cardiovascular: Negative for chest pain.  Gastrointestinal: Negative for abdominal pain, blood in  stool and vomiting.  Genitourinary: Negative for difficulty urinating and frequency.  Musculoskeletal: Negative for neck pain.  Skin: Negative for rash.  Allergic/Immunologic: Negative for environmental allergies and food allergies.  Neurological: Negative for weakness and headaches.  Psychiatric/Behavioral: Negative for agitation.       Objective:   Physical Exam Vitals reviewed.  Constitutional:      General: He is not in acute distress. HENT:     Head: Normocephalic and atraumatic.  Eyes:     General:        Right eye: No discharge.        Left eye: No discharge.  Neck:     Trachea: No tracheal deviation.  Cardiovascular:     Rate and Rhythm: Normal rate and regular rhythm.     Heart sounds: Normal heart sounds. No murmur heard.   Pulmonary:     Effort: Pulmonary effort is normal. No respiratory distress.     Breath sounds: Normal breath sounds.  Lymphadenopathy:     Cervical: No cervical adenopathy.  Skin:    General: Skin is warm and dry.  Neurological:  Mental Status: He is alert.     Coordination: Coordination normal.  Psychiatric:        Behavior: Behavior normal.     Thorough skin exam of his back was completed did not show any abnormal moles Patient with right shoulder discomfort more than likely rotator strain or mild arthritis gel if it gets worse I recommend a specific visit to handle that BPH stable continue Flomax currently we did offer Cialis 5 mg daily if he did not feel the Flomax was doing well enough Cholesterol slightly elevated recommended healthy diet regular activity does not indicate statin at this point Referral to for colonoscopy prefers [facility] Dr. Tarri Glenn    Assessment & Plan:  1. Encounter for screening colonoscopy Referral for colonoscopy [facility] - Ambulatory referral to Gastroenterology  2. Well adult exam Adult wellness-complete.wellness physical was conducted today. Importance of diet and exercise were discussed in  detail.  In addition to this a discussion regarding safety was also covered. We also reviewed over immunizations and gave recommendations regarding current immunization needed for age.  In addition to this additional areas were also touched on including: Preventative health exams needed:  Colonoscopy colonoscopy recommended patient agrees  Patient was advised yearly wellness exam BPH continue treatment with Flomax Labs were reviewed in detail normal PSA

## 2020-12-14 ENCOUNTER — Encounter: Payer: Self-pay | Admitting: Gastroenterology

## 2021-02-16 ENCOUNTER — Other Ambulatory Visit: Payer: Self-pay

## 2021-02-16 ENCOUNTER — Ambulatory Visit (AMBULATORY_SURGERY_CENTER): Payer: Self-pay

## 2021-02-16 VITALS — Ht 70.0 in | Wt 191.0 lb

## 2021-02-16 DIAGNOSIS — Z1211 Encounter for screening for malignant neoplasm of colon: Secondary | ICD-10-CM

## 2021-02-16 MED ORDER — PEG-KCL-NACL-NASULF-NA ASC-C 100 G PO SOLR: 1.0000 | Freq: Once | ORAL | 0 refills | Status: AC

## 2021-02-16 NOTE — Progress Notes (Signed)
No egg or soy allergy known to patient  No issues with past sedation with any surgeries or procedures Patient denies ever being told they had issues or difficulty with intubation  No FH of Malignant Hyperthermia No diet pills per patient No home 02 use per patient  No blood thinners per patient  Pt denies issues with constipation  No A fib or A flutter  EMMI video via MyChart  COVID 19 guidelines implemented in PV today with Pt and RN  Pt is fully vaccinated for Covid x 2  NO PA's for preps discussed with pt in PV today  Discussed with pt there will be an out-of-pocket cost for prep and that varies from $0 to 70 dollars  Due to the COVID-19 pandemic we are asking patients to follow certain guidelines.  Pt aware of COVID protocols and [facility] guidelines  Sutab RX offered to patient and patient declined this option

## 2021-03-02 ENCOUNTER — Ambulatory Visit (AMBULATORY_SURGERY_CENTER): Payer: BC Managed Care – PPO | Admitting: Gastroenterology

## 2021-03-02 ENCOUNTER — Encounter: Payer: Self-pay | Admitting: Gastroenterology

## 2021-03-02 ENCOUNTER — Other Ambulatory Visit: Payer: Self-pay

## 2021-03-02 VITALS — BP 102/68 | HR 54 | Temp 96.6°F | Resp 12 | Ht 70.0 in | Wt 191.0 lb

## 2021-03-02 DIAGNOSIS — K635 Polyp of colon: Secondary | ICD-10-CM

## 2021-03-02 DIAGNOSIS — D120 Benign neoplasm of cecum: Secondary | ICD-10-CM

## 2021-03-02 DIAGNOSIS — Z1211 Encounter for screening for malignant neoplasm of colon: Secondary | ICD-10-CM | POA: Diagnosis not present

## 2021-03-02 MED ORDER — SODIUM CHLORIDE 0.9 % IV SOLN: 500.0000 mL | Freq: Once | INTRAVENOUS | Status: DC

## 2021-03-02 NOTE — Op Note (Signed)
[place] Patient Name: Russell Chen Procedure Date: 03/02/2021 10:19 AM MRN: [id number] Endoscopist: Thornton Park MD, MD Age: 50 Referring MD:  Date of Birth: April 11, 1971 Gender: Male Account #: [id number] Procedure:                Colonoscopy Indications:              Screening for colorectal malignant neoplasm, This                            is the patient's first colonoscopy                           No known family history of colon cancer or polyps Medicines:                Monitored Anesthesia Care Procedure:                Pre-Anesthesia Assessment:                           - Prior to the procedure, a History and Physical                            was performed, and patient medications and                            allergies were reviewed. The patient's tolerance of                            previous anesthesia was also reviewed. The risks                            and benefits of the procedure and the sedation                            options and risks were discussed with the patient.                            All questions were answered, and informed consent                            was obtained. Prior Anticoagulants: The patient has                            taken no previous anticoagulant or antiplatelet                            agents. ASA Grade Assessment: II - A patient with                            mild systemic disease. After reviewing the risks                            and benefits, the patient was deemed in  satisfactory condition to undergo the procedure.                           After obtaining informed consent, the colonoscope                            was passed under direct vision. Throughout the                            procedure, the patient's blood pressure, pulse, and                            oxygen saturations were monitored continuously. The                            Olympus CF-HQ190  (#[id number]) Colonoscope was                            introduced through the anus and advanced to the 3                            cm into the ileum. A second forward view of the                            right colon was performed. The colonoscopy was                            performed without difficulty. The patient tolerated                            the procedure well. The quality of the bowel                            preparation was good. The terminal ileum, ileocecal                            valve, appendiceal orifice, and rectum were                            photographed. Scope In: 10:37:43 AM Scope Out: 10:50:19 AM Scope Withdrawal Time: 0 hours 10 minutes 4 seconds  Total Procedure Duration: 0 hours 12 minutes 36 seconds  Findings:                 The perianal and digital rectal examinations were                            normal.                           Two sessile polyps were found in the cecum. The                            polyps were less than 1 mm in size. These polyps  were removed with a cold snare. Resection and                            retrieval were complete. Estimated blood loss was                            minimal.                           The exam was otherwise without abnormality on                            direct and retroflexion views. Complications:            No immediate complications. Estimated blood loss:                            Minimal. Estimated Blood Loss:     Estimated blood loss was minimal. Impression:               - Two less than 1 mm polyps in the cecum, removed                            with a cold snare. Resected and retrieved.                           - The examination was otherwise normal on direct                            and retroflexion views. Recommendation:           - Patient has a contact number available for                            emergencies. The signs and symptoms of potential                             delayed complications were discussed with the                            patient. Return to normal activities tomorrow.                            Written discharge instructions were provided to the                            patient.                           - Resume previous diet.                           - Continue present medications.                           - Await pathology results.                           -  Repeat colonoscopy date to be determined after                            pending pathology results are reviewed for                            surveillance.                           - Emerging evidence supports eating a diet of                            fruits, vegetables, grains, calcium, and yogurt                            while reducing red meat and alcohol may reduce the                            risk of colon cancer.                           - Thank you for allowing me to be involved in your                            colon cancer prevention. Thornton Park MD, MD 03/02/2021 10:55:57 AM This report has been signed electronically.

## 2021-03-02 NOTE — Patient Instructions (Signed)
Handouts Provided:  Polyps  YOU HAD AN ENDOSCOPIC PROCEDURE TODAY AT THE [facility]:   Refer to the procedure report that was given to you for any specific questions about what was found during the examination.  If the procedure report does not answer your questions, please call your gastroenterologist to clarify.  If you requested that your care partner not be given the details of your procedure findings, then the procedure report has been included in a sealed envelope for you to review at your convenience later.  YOU SHOULD EXPECT: Some feelings of bloating in the abdomen. Passage of more gas than usual.  Walking can help get rid of the air that was put into your GI tract during the procedure and reduce the bloating. If you had a lower endoscopy (such as a colonoscopy or flexible sigmoidoscopy) you may notice spotting of blood in your stool or on the toilet paper. If you underwent a bowel prep for your procedure, you may not have a normal bowel movement for a few days.  Please Note:  You might notice some irritation and congestion in your nose or some drainage.  This is from the oxygen used during your procedure.  There is no need for concern and it should clear up in a day or so.  SYMPTOMS TO REPORT IMMEDIATELY:   Following lower endoscopy (colonoscopy or flexible sigmoidoscopy):  Excessive amounts of blood in the stool  Significant tenderness or worsening of abdominal pains  Swelling of the abdomen that is new, acute  Fever of 100F or higher  For urgent or emergent issues, a gastroenterologist can be reached at any hour by calling [phone]. Do not use MyChart messaging for urgent concerns.    DIET:  We do recommend a small meal at first, but then you may proceed to your regular diet.  Drink plenty of fluids but you should avoid alcoholic beverages for 24 hours.  ACTIVITY:  You should plan to take it easy for the rest of today and you should NOT DRIVE or use heavy  machinery until tomorrow (because of the sedation medicines used during the test).    FOLLOW UP: Our staff will call the number listed on your records 48-72 hours following your procedure to check on you and address any questions or concerns that you may have regarding the information given to you following your procedure. If we do not reach you, we will leave a message.  We will attempt to reach you two times.  During this call, we will ask if you have developed any symptoms of COVID 19. If you develop any symptoms (ie: fever, flu-like symptoms, shortness of breath, cough etc.) before then, please call [phone].  If you test positive for Covid 19 in the 2 weeks post procedure, please call and report this information to us.    If any biopsies were taken you will be contacted by phone or by letter within the next 1-3 weeks.  Please call us at [phone] if you have not heard about the biopsies in 3 weeks.    SIGNATURES/CONFIDENTIALITY: You and/or your care partner have signed paperwork which will be entered into your electronic medical record.  These signatures attest to the fact that that the information above on your After Visit Summary has been reviewed and is understood.  Full responsibility of the confidentiality of this discharge information lies with you and/or your care-partner.  

## 2021-03-02 NOTE — Progress Notes (Signed)
Called to room to assist during endoscopic procedure.  Patient ID and intended procedure confirmed with present staff. Received instructions for my participation in the procedure from the performing physician.  

## 2021-03-02 NOTE — Progress Notes (Signed)
To PACU, VSS. Report to [email or web address] 

## 2021-03-02 NOTE — Progress Notes (Signed)
VS by CW  I have reviewed the patient's medical history in detail and updated the computerized patient record.  

## 2021-03-04 ENCOUNTER — Telehealth: Payer: Self-pay | Admitting: *Deleted

## 2021-03-04 NOTE — Telephone Encounter (Signed)
  Follow up Call-  Call back number 03/02/2021  Post procedure Call Back phone  # [phone]  Permission to leave phone message Yes  Some recent data might be hidden     Patient questions:  Message left to call us if necessary.

## 2021-03-04 NOTE — Telephone Encounter (Signed)
  Follow up Call-  Call back number 03/02/2021  Post procedure Call Back phone  # [phone]  Permission to leave phone message Yes  Some recent data might be hidden     Patient questions:  Do you have a fever, pain , or abdominal swelling? No. Pain Score  0 *  Have you tolerated food without any problems? Yes.    Have you been able to return to your normal activities? Yes.    Do you have any questions about your discharge instructions: Diet   No. Medications  No. Follow up visit  No.  Do you have questions or concerns about your Care? No.  Actions: * If pain score is 4 or above: No action needed, pain <4.   Have you developed a fever since your procedure? no  2.   Have you had an respiratory symptoms (SOB or cough) since your procedure? no  3.   Have you tested positive for COVID 19 since your procedure no  4.   Have you had any family members/close contacts diagnosed with the COVID 19 since your procedure?  no   If yes to any of these questions please route to Joylene John, RN and Joella Prince, RN

## 2021-03-10 ENCOUNTER — Encounter: Payer: Self-pay | Admitting: Gastroenterology

## 2021-04-06 ENCOUNTER — Other Ambulatory Visit: Payer: Self-pay | Admitting: Family Medicine

## 2021-06-07 ENCOUNTER — Other Ambulatory Visit: Payer: Self-pay | Admitting: Family Medicine

## 2021-06-08 DIAGNOSIS — Z23 Encounter for immunization: Secondary | ICD-10-CM | POA: Diagnosis not present

## 2021-06-30 ENCOUNTER — Other Ambulatory Visit (HOSPITAL_COMMUNITY): Payer: Self-pay

## 2021-06-30 MED ORDER — INFLUENZA VAC SPLIT QUAD 0.5 ML IM SUSY
PREFILLED_SYRINGE | INTRAMUSCULAR | 0 refills | Status: DC
  Filled 2021-06-30: qty 0.5, 1d supply, fill #0

## 2021-07-07 ENCOUNTER — Other Ambulatory Visit: Payer: Self-pay | Admitting: Family Medicine

## 2021-07-16 DIAGNOSIS — Z0184 Encounter for antibody response examination: Secondary | ICD-10-CM | POA: Diagnosis not present

## 2021-08-06 ENCOUNTER — Other Ambulatory Visit: Payer: Self-pay | Admitting: Family Medicine

## 2021-08-31 ENCOUNTER — Telehealth: Payer: Self-pay | Admitting: Family Medicine

## 2021-08-31 NOTE — Telephone Encounter (Signed)
Patient has physical in March and needing labs.

## 2021-08-31 NOTE — Telephone Encounter (Signed)
Last labs completed 11/16/20 CMP, PSA, LIPID. Please advise. Thank you

## 2021-08-31 NOTE — Telephone Encounter (Signed)
CBC, lipid, liver, metabolic 7, PSA-for wellness Also hep C antibody, HIV antibody per CDC guidelines Please review these with the patient as our recommendations before ordering just in case he has any changes that he desires to make

## 2021-09-01 ENCOUNTER — Other Ambulatory Visit: Payer: Self-pay

## 2021-09-01 DIAGNOSIS — Z1159 Encounter for screening for other viral diseases: Secondary | ICD-10-CM

## 2021-09-01 DIAGNOSIS — Z131 Encounter for screening for diabetes mellitus: Secondary | ICD-10-CM

## 2021-09-01 DIAGNOSIS — Z114 Encounter for screening for human immunodeficiency virus [HIV]: Secondary | ICD-10-CM

## 2021-09-01 DIAGNOSIS — Z125 Encounter for screening for malignant neoplasm of prostate: Secondary | ICD-10-CM

## 2021-09-01 DIAGNOSIS — Z0000 Encounter for general adult medical examination without abnormal findings: Secondary | ICD-10-CM

## 2021-09-01 DIAGNOSIS — Z13220 Encounter for screening for lipoid disorders: Secondary | ICD-10-CM

## 2021-09-01 NOTE — Telephone Encounter (Signed)
Left message for a return call

## 2021-09-04 ENCOUNTER — Other Ambulatory Visit: Payer: Self-pay

## 2021-09-04 ENCOUNTER — Ambulatory Visit
Admission: EM | Admit: 2021-09-04 | Discharge: 2021-09-04 | Disposition: A | Discharge status: Home / Self Care | Payer: BC Managed Care – PPO | Attending: Family Medicine | Admitting: Family Medicine

## 2021-09-04 DIAGNOSIS — S46912A Strain of unspecified muscle, fascia and tendon at shoulder and upper arm level, left arm, initial encounter: Secondary | ICD-10-CM

## 2021-09-04 MED ORDER — PREDNISONE 20 MG PO TABS: 40.0000 mg | ORAL_TABLET | Freq: Every day | ORAL | 0 refills | Status: DC

## 2021-09-04 MED ORDER — CYCLOBENZAPRINE HCL 10 MG PO TABS: 10.0000 mg | ORAL_TABLET | Freq: Two times a day (BID) | ORAL | 0 refills | Status: DC | PRN

## 2021-09-04 NOTE — ED Triage Notes (Signed)
Patient states that all week he has had pains in his left shoulder blade and it has progressed to going down the left arm.  He states he took Advil for pain this morning.   Denies Injury.

## 2021-09-04 NOTE — ED Provider Notes (Signed)
[facility]    CSN: [id number] Arrival date & time: 09/04/21  0903      History   Chief Complaint Chief Complaint  Patient presents with   Shoulder Pain    Left shoulder pain   HPI Russell Chen is a 50 y.o. male.   Patient presenting today with going on a week of left lateral upper back pain near her shoulder blade that has progressively begun radiating to left shoulder and now anterior shoulder is very sore and in spasm.  States certain movements, lifting and direct pressure seem to make the pain worse.  Denies numbness, tingling, weakness of the arm, chest pain, shortness of breath, dizziness, headaches.  No known obvious injury or inciting event.  Does get some relief from Aleve and heat, rest.   Past Medical History:  Diagnosis Date   Allergy    seasonal allergies    Patient Active Problem List   Diagnosis Date Noted   Prepatellar bursitis 05/28/2012   Ganglion cyst 05/28/2012   Effusion of prepatellar bursa 05/03/2012    Past Surgical History:  Procedure Laterality Date   FOREIGN BODY REMOVAL  05/25/2012   Procedure: FOREIGN BODY REMOVAL ADULT;  Surgeon: Carole Civil, MD;  Location: [facility];  Service: Orthopedics;  Laterality: Right;  right knee foreign body   MASS EXCISION  05/25/2012   Procedure: EXCISION MASS;  Surgeon: Carole Civil, MD;  Location: [facility];  Service: Orthopedics;  Laterality: Right;  right knee mass   VASECTOMY     WISDOM TOOTH EXTRACTION       Home Medications    Prior to Admission medications   Medication Sig Start Date End Date Taking? Authorizing Provider  cyclobenzaprine (FLEXERIL) 10 MG tablet Take 1 tablet (10 mg total) by mouth 2 (two) times daily as needed for muscle spasms. Do not drink alcohol or drive while taking this medication.  May cause drowsiness. 09/04/21  Yes Volney American, PA-C  predniSONE (DELTASONE) 20 MG tablet Take 2 tablets (40 mg total) by mouth daily with breakfast. 09/04/21  Yes  Volney American, PA-C  influenza vac split quadrivalent PF (FLUARIX) 0.5 ML injection Inject into the muscle. 06/30/21   Carlyle Basques, MD  LORATADINE PO Take 1 tablet by mouth daily at 6 (six) AM.    [provider]  Multiple Vitamins-Minerals (MULTIVITAMIN PO) Take 1 tablet by mouth daily.     [provider]  tamsulosin (FLOMAX) 0.4 MG CAPS capsule TAKE 1 CAPSULE BY MOUTH ONCE DAILY AT BEDTIME. 08/06/21   Kathyrn Drown, MD   Family History Family History  Problem Relation Age of Onset   Diabetes Father        pills   Cancer Sister        skin cancer   Cancer Maternal Grandmother        skin cancer   Heart disease Paternal Grandfather        BPH   Renal Disease Mother        obesity age 33   Colon polyps Neg Hx    Colon cancer Neg Hx    Esophageal cancer Neg Hx    Stomach cancer Neg Hx    Rectal cancer Neg Hx     Social History Social History   Tobacco Use   Smoking status: Former    Years: 0.50    Types: Cigarettes    Quit date: 05/22/1992    Years since quitting: 29.3   Smokeless  tobacco: Never  Vaping Use   Vaping Use: Never used  Substance Use Topics   Alcohol use: No    Comment: 2 times per year   Drug use: No    Allergies   Patient has no known allergies.   Review of Systems Review of Systems Per HPI  Physical Exam Triage Vital Signs ED Triage Vitals  Enc Vitals Group     BP 09/04/21 1059 117/76     Pulse Rate 09/04/21 1059 70     Resp 09/04/21 1059 18     Temp 09/04/21 1059 97.9 F (36.6 C)     Temp Source 09/04/21 1059 Oral     SpO2 09/04/21 1059 97 %     Weight --      Height --      Head Circumference --      Peak Flow --      Pain Score 09/04/21 1057 10     Pain Loc --      Pain Edu? --      Excl. in [place]? --    No data found.  Updated Vital Signs BP 117/76 (BP Location: Right Arm)    Pulse 70    Temp 97.9 F (36.6 C) (Oral)    Resp 18    SpO2 97%   Visual Acuity Right Eye Distance:   Left Eye  Distance:   Bilateral Distance:    Right Eye Near:   Left Eye Near:    Bilateral Near:     Physical Exam Vitals and nursing note reviewed.  Constitutional:      Appearance: Normal appearance.  HENT:     Head: Atraumatic.  Eyes:     Extraocular Movements: Extraocular movements intact.     Conjunctiva/sclera: Conjunctivae normal.  Cardiovascular:     Rate and Rhythm: Normal rate and regular rhythm.     Heart sounds: Normal heart sounds.  Pulmonary:     Effort: Pulmonary effort is normal.     Breath sounds: Normal breath sounds. No wheezing.  Musculoskeletal:        General: Tenderness present. No swelling, deformity or signs of injury. Normal range of motion.     Cervical back: Normal range of motion and neck supple.     Comments: No midline c spine ttp. Ttp left scapular region, latissimus, deltoid muscles worse with ROM  Skin:    General: Skin is warm and dry.  Neurological:     General: No focal deficit present.     Mental Status: He is oriented to person, place, and time.  Psychiatric:        Mood and Affect: Mood normal.        Thought Content: Thought content normal.        Judgment: Judgment normal.   UC Treatments / Results  Labs (all labs ordered are listed, but only abnormal results are displayed) Labs Reviewed - No data to display  EKG   Radiology No results found.  Procedures Procedures (including critical care time)  Medications Ordered in UC Medications - No data to display  Initial Impression / Assessment and Plan / UC Course  I have reviewed the triage vital signs and the nursing notes.  Pertinent labs & imaging results that were available during my care of the patient were reviewed by me and considered in my medical decision making (see chart for details).     Vital signs and exam overall reassuring indicative of a left shoulder strain.  Treat with  prednisone, Flexeril, stretches, heat, rest.  Return for acutely worsening symptoms.  Final  Clinical Impressions(s) / UC Diagnoses   Final diagnoses:  Strain of left shoulder, initial encounter   Discharge Instructions   None    ED Prescriptions     Medication Sig Dispense Auth. Provider   predniSONE (DELTASONE) 20 MG tablet Take 2 tablets (40 mg total) by mouth daily with breakfast. 10 tablet Volney American, PA-C   cyclobenzaprine (FLEXERIL) 10 MG tablet Take 1 tablet (10 mg total) by mouth 2 (two) times daily as needed for muscle spasms. Do not drink alcohol or drive while taking this medication.  May cause drowsiness. 10 tablet Volney American, [place]      PDMP not reviewed this encounter.   Volney American, [place] 09/04/21 1140

## 2021-09-06 ENCOUNTER — Other Ambulatory Visit: Payer: Self-pay

## 2021-09-06 ENCOUNTER — Ambulatory Visit (INDEPENDENT_AMBULATORY_CARE_PROVIDER_SITE_OTHER): Payer: BC Managed Care – PPO | Admitting: Family Medicine

## 2021-09-06 ENCOUNTER — Encounter: Payer: Self-pay | Admitting: Family Medicine

## 2021-09-06 ENCOUNTER — Ambulatory Visit (HOSPITAL_COMMUNITY)
Admission: RE | Admit: 2021-09-06 | Discharge: 2021-09-06 | Disposition: A | Discharge status: Home / Self Care | Payer: BC Managed Care – PPO | Source: Ambulatory Visit | Attending: Family Medicine | Admitting: Family Medicine

## 2021-09-06 ENCOUNTER — Other Ambulatory Visit: Payer: Self-pay | Admitting: Family Medicine

## 2021-09-06 VITALS — BP 118/74 | HR 85 | Temp 98.3°F | Ht 70.0 in | Wt 194.0 lb

## 2021-09-06 DIAGNOSIS — M7918 Myalgia, other site: Secondary | ICD-10-CM | POA: Diagnosis not present

## 2021-09-06 DIAGNOSIS — M542 Cervicalgia: Secondary | ICD-10-CM | POA: Diagnosis not present

## 2021-09-06 MED ORDER — TIZANIDINE HCL 4 MG PO TABS: 4.0000 mg | ORAL_TABLET | Freq: Three times a day (TID) | ORAL | 0 refills | Status: DC | PRN

## 2021-09-06 MED ORDER — TAMSULOSIN HCL 0.4 MG PO CAPS
0.4000 mg | ORAL_CAPSULE | Freq: Every day | ORAL | 0 refills | Status: DC
Start: 2021-09-06 — End: 2021-12-03

## 2021-09-06 NOTE — Patient Instructions (Signed)
Xray at the hospital.  Stop the Flexeril. Start Zanaflex.  Finish the prednisone.  Lots of heat.  Consider massage.  Follow up as needed (if this persists).

## 2021-09-06 NOTE — Progress Notes (Signed)
Subjective:  Patient ID: Russell Chen, male    DOB: July 18, 1971  Age: 50 y.o. MRN: [id number]  CC: Chief Complaint  Patient presents with   left shoulder and arm pain     X 1 week - worsens with movement    HPI:  50 year old male presents with the above complaints.  Patient states that this started last Monday.  He states that he has recently been traveling and was carrying bags on his shoulders [place] and out of airports.  Patient also notes recent repetitive activity at work with his left upper extremity.  He states that he has had ongoing pain.  Started in the left trapezius region and then migrated to the periscapular region.  He is now having pain predominantly around the shoulder, bicep, and extending down the left arm.  No numbness or tingling.  Patient notes pain.  Moderate to severe at times.  He was seen in urgent care recently and was placed on prednisone and Flexeril.  He continues to have symptoms.  Worse with certain ranges of motion of the neck as well as the arm.  Patient Active Problem List   Diagnosis Date Noted   Musculoskeletal pain 09/06/2021   Ganglion cyst 05/28/2012    Social Hx   Social History   Socioeconomic History   Marital status: Married    Spouse name: Not on file   Number of children: Not on file   Years of education: college   Highest education level: Not on file  Occupational History   Not on file  Tobacco Use   Smoking status: Former    Years: 0.50    Types: Cigarettes    Quit date: 05/22/1992    Years since quitting: 29.3   Smokeless tobacco: Never  Vaping Use   Vaping Use: Never used  Substance and Sexual Activity   Alcohol use: No    Comment: 2 times per year   Drug use: No   Sexual activity: Yes    Birth control/protection: None  Other Topics Concern   Not on file  Social History Narrative   Not on file   Social Determinants of Health   Financial Resource Strain: Not on file  Food Insecurity: Not on file  Transportation  Needs: Not on file  Physical Activity: Not on file  Stress: Not on file  Social Connections: Not on file    Review of Systems Per HPI  Objective:  BP 118/74    Pulse 85    Temp 98.3 F (36.8 C)    Ht 5\' 10"  (1.778 m)    Wt 194 lb (88 kg)    SpO2 98%    BMI 27.84 kg/m   BP/Weight 09/06/2021 09/04/2021 4/78/2956  Systolic BP 213 086 578  Diastolic BP 74 76 68  Wt. (Lbs) 194 - 191  BMI 27.84 - 27.41    Physical Exam Constitutional:      General: He is not in acute distress.    Appearance: Normal appearance. He is not ill-appearing.  HENT:     Head: Normocephalic and atraumatic.  Cardiovascular:     Rate and Rhythm: Normal rate and regular rhythm.  Pulmonary:     Effort: Pulmonary effort is normal.     Breath sounds: Normal breath sounds. No wheezing, rhonchi or rales.  Musculoskeletal:     Comments: Normal range of motion of the left shoulder.  No tenderness of the bicipital groove. Tightness over the left trapezius.  Neurological:  Mental Status: He is alert.  Psychiatric:        Mood and Affect: Mood normal.        Behavior: Behavior normal.    Lab Results  Component Value Date   WBC 4.2 10/17/2019   HGB 13.9 10/17/2019   HCT 40.9 10/17/2019   PLT 189 10/17/2019   GLUCOSE 95 11/16/2020   CHOL 203 (H) 11/16/2020   TRIG 80 11/16/2020   HDL 49 11/16/2020   LDLCALC 140 (H) 11/16/2020   ALT 33 11/16/2020   AST 30 11/16/2020   NA 145 (H) 11/16/2020   K 4.6 11/16/2020   CL 107 (H) 11/16/2020   CREATININE 1.07 11/16/2020   BUN 20 11/16/2020   CO2 24 11/16/2020     Assessment & Plan:   Problem List Items Addressed This Visit       Other   Musculoskeletal pain - Primary    This appears to be muscular in origin.  However concern for possible cervical radiculopathy.  As result, cervical spine films were taken today.  Independent interpretation: Normal cervical spine films.  No evidence of degenerative disc disease, spondylosis. Advised to finish  prednisone burst.  Stop Flexeril.  Start Zanaflex.  Advised heat.  Supportive care.      Relevant Orders   DG Cervical Spine Complete (Completed)    Meds ordered this encounter  Medications   tamsulosin (FLOMAX) 0.4 MG CAPS capsule    Sig: Take 1 capsule (0.4 mg total) by mouth at bedtime.    Dispense:  90 capsule    Refill:  0   tiZANidine (ZANAFLEX) 4 MG tablet    Sig: Take 1 tablet (4 mg total) by mouth every 8 (eight) hours as needed for muscle spasms.    Dispense:  30 tablet    Refill:  0    Follow-up:  Return if symptoms worsen or fail to improve.  [place]

## 2021-09-06 NOTE — Assessment & Plan Note (Signed)
This appears to be muscular in origin.  However concern for possible cervical radiculopathy.  As result, cervical spine films were taken today.  Independent interpretation: Normal cervical spine films.  No evidence of degenerative disc disease, spondylosis. Advised to finish prednisone burst.  Stop Flexeril.  Start Zanaflex.  Advised heat.  Supportive care.

## 2021-09-09 NOTE — Telephone Encounter (Signed)
Mychart message sent to patient.

## 2021-09-15 NOTE — Telephone Encounter (Signed)
Pt read mychart message.

## 2021-11-23 DIAGNOSIS — Z13220 Encounter for screening for lipoid disorders: Secondary | ICD-10-CM | POA: Diagnosis not present

## 2021-11-23 DIAGNOSIS — Z1159 Encounter for screening for other viral diseases: Secondary | ICD-10-CM | POA: Diagnosis not present

## 2021-11-23 DIAGNOSIS — Z114 Encounter for screening for human immunodeficiency virus [HIV]: Secondary | ICD-10-CM | POA: Diagnosis not present

## 2021-11-23 DIAGNOSIS — Z125 Encounter for screening for malignant neoplasm of prostate: Secondary | ICD-10-CM | POA: Diagnosis not present

## 2021-11-23 DIAGNOSIS — Z0000 Encounter for general adult medical examination without abnormal findings: Secondary | ICD-10-CM | POA: Diagnosis not present

## 2021-11-24 LAB — HEPATIC FUNCTION PANEL
ALT: 24 IU/L (ref 0–44)
AST: 21 IU/L (ref 0–40)
Albumin: 4.6 g/dL (ref 4.0–5.0)
Alkaline Phosphatase: 61 IU/L (ref 44–121)
Bilirubin Total: 0.5 mg/dL (ref 0.0–1.2)
Bilirubin, Direct: 0.12 mg/dL (ref 0.00–0.40)
Total Protein: 6.6 g/dL (ref 6.0–8.5)

## 2021-11-24 LAB — BASIC METABOLIC PANEL
BUN/Creatinine Ratio: 12 (ref 9–20)
BUN: 15 mg/dL (ref 6–24)
CO2: 26 mmol/L (ref 20–29)
Calcium: 9 mg/dL (ref 8.7–10.2)
Chloride: 105 mmol/L (ref 96–106)
Creatinine, Ser: 1.25 mg/dL (ref 0.76–1.27)
Glucose: 91 mg/dL (ref 70–99)
Potassium: 4.2 mmol/L (ref 3.5–5.2)
Sodium: 143 mmol/L (ref 134–144)
eGFR: 70 mL/min/{1.73_m2} (ref >59)

## 2021-11-24 LAB — LIPID PANEL
Chol/HDL Ratio: 4.5 ratio (ref 0.0–5.0)
Cholesterol, Total: 198 mg/dL (ref 100–199)
HDL: 44 mg/dL (ref >39)
LDL Chol Calc (NIH): 141 mg/dL — ABNORMAL HIGH (ref 0–99)
Triglycerides: 69 mg/dL (ref 0–149)
VLDL Cholesterol Cal: 13 mg/dL (ref 5–40)

## 2021-11-24 LAB — CBC WITH DIFFERENTIAL/PLATELET
Basophils Absolute: 0.1 10*3/uL (ref 0.0–0.2)
Basos: 2 %
EOS (ABSOLUTE): 0.2 10*3/uL (ref 0.0–0.4)
Eos: 4 %
Hematocrit: 41.6 % (ref 37.5–51.0)
Hemoglobin: 14 g/dL (ref 13.0–17.7)
Immature Grans (Abs): 0 10*3/uL (ref 0.0–0.1)
Immature Granulocytes: 0 %
Lymphocytes Absolute: 1.3 10*3/uL (ref 0.7–3.1)
Lymphs: 30 %
MCH: 27.7 pg (ref 26.6–33.0)
MCHC: 33.7 g/dL (ref 31.5–35.7)
MCV: 82 fL (ref 79–97)
Monocytes Absolute: 0.4 10*3/uL (ref 0.1–0.9)
Monocytes: 9 %
Neutrophils Absolute: 2.4 10*3/uL (ref 1.4–7.0)
Neutrophils: 55 %
Platelets: 186 10*3/uL (ref 150–450)
RBC: 5.05 x10E6/uL (ref 4.14–5.80)
RDW: 13.7 % (ref 11.6–15.4)
WBC: 4.3 10*3/uL (ref 3.4–10.8)

## 2021-11-24 LAB — HIV ANTIBODY (ROUTINE TESTING W REFLEX): HIV Screen 4th Generation wRfx: NONREACTIVE

## 2021-11-24 LAB — PSA: Prostate Specific Ag, Serum: 1.8 ng/mL (ref 0.0–4.0)

## 2021-11-24 LAB — HEPATITIS C ANTIBODY: Hep C Virus Ab: NONREACTIVE

## 2021-12-03 ENCOUNTER — Other Ambulatory Visit: Payer: Self-pay | Admitting: Family Medicine

## 2021-12-06 ENCOUNTER — Encounter: Payer: BC Managed Care – PPO | Admitting: Family Medicine

## 2022-01-06 ENCOUNTER — Ambulatory Visit (INDEPENDENT_AMBULATORY_CARE_PROVIDER_SITE_OTHER): Payer: BC Managed Care – PPO | Admitting: Family Medicine

## 2022-01-06 ENCOUNTER — Encounter: Payer: Self-pay | Admitting: Family Medicine

## 2022-01-06 VITALS — BP 130/82 | HR 66 | Temp 97.9°F | Ht 69.0 in | Wt 190.6 lb

## 2022-01-06 DIAGNOSIS — Z0000 Encounter for general adult medical examination without abnormal findings: Secondary | ICD-10-CM

## 2022-01-06 DIAGNOSIS — M25511 Pain in right shoulder: Secondary | ICD-10-CM | POA: Diagnosis not present

## 2022-01-06 DIAGNOSIS — E785 Hyperlipidemia, unspecified: Secondary | ICD-10-CM | POA: Diagnosis not present

## 2022-01-06 DIAGNOSIS — H6123 Impacted cerumen, bilateral: Secondary | ICD-10-CM

## 2022-01-06 DIAGNOSIS — G8929 Other chronic pain: Secondary | ICD-10-CM

## 2022-01-06 DIAGNOSIS — Z23 Encounter for immunization: Secondary | ICD-10-CM

## 2022-01-06 DIAGNOSIS — Z125 Encounter for screening for malignant neoplasm of prostate: Secondary | ICD-10-CM

## 2022-01-06 MED ORDER — TAMSULOSIN HCL 0.4 MG PO CAPS: 0.4000 mg | ORAL_CAPSULE | Freq: Every day | ORAL | 3 refills | Status: DC

## 2022-01-06 NOTE — Progress Notes (Signed)
? ?  Subjective:  ? ? Patient ID: Russell Chen, male    DOB: July 08, 1971, 51 y.o.   MRN: [id number] ? ?HPI ? ?The patient comes in today for a wellness visit. ? ? ? ?A review of their health history was completed. ? A review of medications was also completed. ? ?Any needed refills; No ? ?Eating habits: Good ? ?Falls/  MVA accidents in past few months: No ? ?Regular exercise: Sometimes ? ?Specialist pt sees on regular basis: No ? ?Preventative health issues were discussed.  ? ?Additional concerns: No  ? ?Review of Systems ? ?   ?Objective:  ? Physical Exam ? ?General-in no acute distress ?Eyes-no discharge ?Lungs-respiratory rate normal, CTA ?CV-no murmurs,RRR ?Extremities skin warm dry no edema ?Neuro grossly normal ?Behavior normal, alert ? ?The 10-year ASCVD risk score (Arnett DK, et al., 2019) is: 4% ?  Values used to calculate the score: ?    Age: 31 years ?    Sex: Male ?    Is Non-Hispanic African American: No ?    Diabetic: No ?    Tobacco smoker: No ?    Systolic Blood Pressure: 993 mmHg ?    Is BP treated: No ?    HDL Cholesterol: 44 mg/dL ?    Total Cholesterol: 198 mg/dL ?Does have cerumen impaction bilateral ?Right shoulder pain with decresed ROM ?   ?Assessment & Plan:  ?Adult wellness-complete.wellness physical was conducted today. Importance of diet and exercise were discussed in detail.  ?In addition to this a discussion regarding safety was also covered. We also reviewed over immunizations and gave recommendations regarding current immunization needed for age.  ?In addition to this additional areas were also touched on including: ?Preventative health exams needed: ? ?Colonoscopy colonoscopy 2022 he is good for 10 years from that time ? ?Patient was advised yearly wellness exam ? ?Does not smoke does not drink ?His risk for heart disease is low ?Recommend lipid profile and 6 months ? ?Possible statin in 6 months or cor calcium score ?Right shoulder pain and discomfort recommend ortho ?

## 2022-01-11 ENCOUNTER — Encounter: Payer: Self-pay | Admitting: Family Medicine

## 2022-01-11 NOTE — Telephone Encounter (Signed)
Nurses ?Please move him up with an appointment to have ears irrigated as quickly as possible thanks ?

## 2022-01-13 ENCOUNTER — Other Ambulatory Visit: Payer: BC Managed Care – PPO

## 2022-01-20 DIAGNOSIS — M7521 Bicipital tendinitis, right shoulder: Secondary | ICD-10-CM | POA: Diagnosis not present

## 2022-01-26 ENCOUNTER — Other Ambulatory Visit: Payer: BC Managed Care – PPO

## 2022-01-31 ENCOUNTER — Ambulatory Visit (HOSPITAL_COMMUNITY): Payer: BC Managed Care – PPO | Attending: Orthopedic Surgery | Admitting: Physical Therapy

## 2022-01-31 DIAGNOSIS — M25511 Pain in right shoulder: Secondary | ICD-10-CM | POA: Insufficient documentation

## 2022-01-31 DIAGNOSIS — M6281 Muscle weakness (generalized): Secondary | ICD-10-CM | POA: Insufficient documentation

## 2022-01-31 DIAGNOSIS — M25611 Stiffness of right shoulder, not elsewhere classified: Secondary | ICD-10-CM | POA: Diagnosis not present

## 2022-01-31 NOTE — Therapy (Signed)
?OUTPATIENT PHYSICAL THERAPY SHOULDER EVALUATION ? ? ?Patient Name: Russell Chen ?MRN: [id number] ?DOB:[birth year], 51 y.o., male ?Today's Date: 01/31/2022 ? ? PT End of Session - 01/31/22 4081   ? ? Visit Number 1   ? Number of Visits 8   ? Date for PT Re-Evaluation 06/14 ?/23   ? Authorization Type BCBS   ? Authorization - Visit Number 1   ? Authorization - Number of Visits 30   ? PT Start Time 4481   ? PT Stop Time 1000   ? PT Time Calculation (min) 45 min   ? Equipment Utilized During Treatment Gait belt   ? Activity Tolerance Patient tolerated treatment well   ? Behavior During Therapy [facility] for tasks assessed/performed   ? ?  ?  ? ?  ? ? ?Past Medical History:  ?Diagnosis Date  ? Allergy   ? seasonal allergies  ? ?Past Surgical History:  ?Procedure Laterality Date  ? FOREIGN BODY REMOVAL  05/25/2012  ? Procedure: FOREIGN BODY REMOVAL ADULT;  Surgeon: Carole Civil, MD;  Location: [facility];  Service: Orthopedics;  Laterality: Right;  right knee foreign body  ? MASS EXCISION  05/25/2012  ? Procedure: EXCISION MASS;  Surgeon: Carole Civil, MD;  Location: [facility];  Service: Orthopedics;  Laterality: Right;  right knee mass  ? VASECTOMY    ? WISDOM TOOTH EXTRACTION    ? ?Patient Active Problem List  ? Diagnosis Date Noted  ? Musculoskeletal pain 09/06/2021  ? Ganglion cyst 05/28/2012  ? ? ?PCP: Dr. Wolfgang Phoenix  ? ?REFERRING PROVIDER:  ?PT eval/tx for M75.21 Bicipital tendinitis, Rt shoulder per Victorino December, MD  ?REFERRING DIAG: as above   ? ?THERAPY DIAG:  ?Acute pain of Right  shoulder ? ? ?ONSET DATE: Over a year but pain has increased significantly in the past six months.  ? ?SUBJECTIVE:                                                                                                                                                                                     ? ?SUBJECTIVE STATEMENT: ?Pt states that he has been having pain in his RT shoulder for over 18 months but the pain has increased in the past six months.   He has increased pain when weed eating, playing ball with his son or lifting.  He notes that his Right shoulder feels stiff when moving without wt; pain with weighted activity. Pt has pain when sleeping on his RT side.  ? ?PERTINENT HISTORY: ?Unremarkable  ? ?PAIN:  ?Are you having pain? No  Highest pain 0-10  7/10 dull pain  ? ?PRECAUTIONS: None ? ?WEIGHT  BEARING RESTRICTIONS No ? ?FALLS:  ?Has patient fallen in last 6 months? No ? 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 ?PLOF: Independent ? ?PATIENT GOALS To have less pain  ? ?OBJECTIVE:  ? ?DIAGNOSTIC FINDINGS:  ?IMPRESSION: ?Negative cervical spine radiographs. ?  ? ?PATIENT SURVEYS:  ?FOTO 31 ? ? ? ?POSTURE: ?Normal  ? ?UPPER EXTREMITY ROM:  sitting  ? ?Active ROM Right ?01/31/2022 Left ?01/31/2022  ?Shoulder flexion WNL WNL  ?Shoulder extension    ?Shoulder abduction WNL but with pain  WNL  ?Shoulder adduction    ?Shoulder internal rotation WNL WNL  ?Shoulder external rotation WNL WNL  ?Elbow flexion WNL WNL  ?Elbow extension WNL WNL   ? ?UPPER EXTREMITY MMT: ? ?MMT Right ?01/31/2022 Left ?01/31/2022  ?Shoulder flexion 4+/5 5/5  ?Shoulder extension    ?Shoulder abduction 4+/5 5/5  ?Shoulder adduction    ?Shoulder internal rotation 4+/5 5/5  ?Shoulder external rotation 3+/5 5/5  ?Middle trapezius    ?Lower trapezius    ?Elbow flexion    ?Elbow extension    ? Supraspinatus                                  4+/5  ? ?PALPATION:  ?Tenderness to bicipital tendon on RT  ?  ?TODAY'S TREATMENT:  ?5/15:  Self friction massage ?          Side lying ER with towel in pectoral area for shoulder positioning; 1# x 10 ?          Prone shoulder extension x 10  ? ? ?PATIENT EDUCATION: ?Education details: HEP ?Person educated: Patient ?Education method: Explanation, Tactile cues, Verbal cues, and Handouts ?Education comprehension: verbalized understanding and returned demonstration ? ? ?HOME EXERCISE  PROGRAM: ?Self friction massage ?          Side lying ER with towel in pectoral area for shoulder positioning; 1# x 10 ?          Prone shoulder extension x 10 ? ?ASSESSMENT: ? ?CLINICAL IMPRESSION: ?Patient is a 51 y.o. male who was seen today for physical therapy evaluation and treatment for Rt bicipital tendonitis with Rt shoulder pain.  Evaluation demonstrates decreased strength, decreased ability to lift, stiffness of right shoulder as well as increased pain.  Mr. Scully will benefit from skilled PT for education on self manual techniques, stretching and strengthening to improve his functioning abilty.    ? ? ?OBJECTIVE IMPAIRMENTS decreased activity tolerance, decreased strength, impaired flexibility, impaired UE functional use, and pain.  ? ?ACTIVITY LIMITATIONS yard work and playing ball with his son, Rt side lying  .  ? ?PERSONAL FACTORS Time since onset of injury/illness/exacerbation are also affecting patient's functional outcome.  ? ? ?REHAB POTENTIAL: Good ? ?CLINICAL DECISION MAKING: Stable/uncomplicated ? ?EVALUATION COMPLEXITY: Low ? ? ?GOALS: ?Goals reviewed with patient? No ? ?SHORT TERM GOALS: Target date: 6529/2023   ? ?Pt to be I in HEP to allow his RT shoulder pain to be no greater than a 4/10 ?Baseline: ?Goal status: INITIAL ? ?2.  Pt to be I in self friction massage to assist in decreasing pain ?Baseline:  ?Goal status: INITIAL ? ?3.  Pt to be able to reach above shoulder ht with 5 # without increased pain  ?Baseline:  ?Goal status: INITIAL ? ? ? ?LONG TERM GOALS: Target date: 02/28/2022  (Remove Blue Hyperlink) ? ?Pt  to be I in advanced HEP to decrease pain to no greater than a 3/10 ?Baseline:  ?Goal status: INITIAL ? ?2.  Pt shoulder mm to be at least 5-/5 to be able to throw a baseball with his son without experiencing increased pain  ?Baseline:  ?Goal status: INITIAL ? ?3.  PT to be able to sleep on his right side  ?Baseline:  ?Goal status: INITIAL ? ? ?PLAN: ?PT FREQUENCY: 2x/week ? ?PT  DURATION: 4 weeks ? ?PLANNED INTERVENTIONS: Therapeutic exercises, Patient/Family education, and Manual therapy ? ?PLAN FOR NEXT SESSION: prone external rotation, prone horizontal abduction, supraspinatus lift, pectoral stretch, Continue to strengthen rotator cuff as able. ?Manual for pain relief  ?Rayetta Humphrey, PT CLT ?[phone]  ?01/31/2022, 9:20 AM  ?

## 2022-02-04 ENCOUNTER — Ambulatory Visit (HOSPITAL_COMMUNITY): Payer: BC Managed Care – PPO

## 2022-02-04 ENCOUNTER — Encounter (HOSPITAL_COMMUNITY): Payer: Self-pay

## 2022-02-04 DIAGNOSIS — M25511 Pain in right shoulder: Secondary | ICD-10-CM | POA: Diagnosis not present

## 2022-02-04 DIAGNOSIS — M25611 Stiffness of right shoulder, not elsewhere classified: Secondary | ICD-10-CM | POA: Diagnosis not present

## 2022-02-04 DIAGNOSIS — M6281 Muscle weakness (generalized): Secondary | ICD-10-CM | POA: Diagnosis not present

## 2022-02-04 NOTE — Therapy (Addendum)
OUTPATIENT PHYSICAL THERAPY SHOULDER EVALUATION   Patient Name: Russell Chen MRN: [id number] DOB:1971-01-18, 51 y.o., male Today's Date: 02/04/2022 END OF SESSION:   PT End of Session - 02/04/22 0814     Visit Number 2    Number of Visits 12    Date for PT Re-Evaluation 03/14/22    Authorization Type BCBS    Authorization - Visit Number 2    Authorization - Number of Visits 30    PT Start Time 0815    PT Stop Time 7619    PT Time Calculation (min) 40 min    Equipment Utilized During Treatment Gait belt    Activity Tolerance Patient tolerated treatment well    Behavior During Therapy WFL for tasks assessed/performed                         Past Medical History:  Diagnosis Date   Allergy    seasonal allergies   Past Surgical History:  Procedure Laterality Date   FOREIGN BODY REMOVAL  05/25/2012   Procedure: FOREIGN BODY REMOVAL ADULT;  Surgeon: Carole Civil, MD;  Location: [facility];  Service: Orthopedics;  Laterality: Right;  right knee foreign body   MASS EXCISION  05/25/2012   Procedure: EXCISION MASS;  Surgeon: Carole Civil, MD;  Location: [facility];  Service: Orthopedics;  Laterality: Right;  right knee mass   VASECTOMY     WISDOM TOOTH EXTRACTION     Patient Active Problem List   Diagnosis Date Noted   Musculoskeletal pain 09/06/2021   Ganglion cyst 05/28/2012    PCP: Dr. Wolfgang Phoenix   REFERRING PROVIDER:  PT eval/tx for M75.21 Bicipital tendinitis, Rt shoulder per Victorino December, MD  REFERRING DIAG: as above    THERAPY DIAG:  Acute pain of Right  shoulder   ONSET DATE: Over a year but pain has increased significantly in the past six months.   SUBJECTIVE:                                                                                                                                                                                      SUBJECTIVE STATEMENT: Pt reports that shoulder is feeling much better with the stretching and few exercises given  at eval. Patient reports  he will be doing his more strenuous work this weekend and will report back then how shoulder is doing after.    PERTINENT HISTORY: Unremarkable   PAIN:  Are you having pain? No  No pain this morning 0/10 Highest pain 0-10  7/10 dull pain but has not experienced it in several days.   PRECAUTIONS: None  WEIGHT BEARING RESTRICTIONS No  FALLS:  Has patient fallen in last 6 months? No  LIVING ENVIRONMENT: Lives with: lives with their family Lives in: House/apartment OCCUPATION: Desk job   PLOF: Independent  PATIENT GOALS To have less pain   OBJECTIVE:   DIAGNOSTIC FINDINGS:  IMPRESSION: Negative cervical spine radiographs.    PATIENT SURVEYS:  FOTO 70    POSTURE: Normal   UPPER EXTREMITY ROM:  sitting   Active ROM Right 02/04/2022 Left 02/04/2022  Shoulder flexion WNL WNL  Shoulder extension    Shoulder abduction WNL but with pain  WNL  Shoulder adduction    Shoulder internal rotation WNL WNL  Shoulder external rotation WNL WNL  Elbow flexion WNL WNL  Elbow extension WNL WNL    UPPER EXTREMITY MMT:  MMT Right 02/04/2022 Left 02/04/2022  Shoulder flexion 4+/5 5/5  Shoulder extension    Shoulder abduction 4+/5 5/5  Shoulder adduction    Shoulder internal rotation 4+/5 5/5  Shoulder external rotation 3+/5 5/5  Middle trapezius    Lower trapezius    Elbow flexion    Elbow extension     Supraspinatus                                  4+/5   PALPATION:  Tenderness to bicipital tendon on RT    TODAY'S TREATMENT:    5/19  Doorway stretch low, middle and high 3x15 sec each   Posterior capsule stretch in sidelying 3x15 sec  Prone shoulder ER x30  Prone T lifts with 3 sec hold x20  Prone Y lifts with 3 sec hold x20  Standing ER at 0 degrees flexion and 90 degrees of flexion x30 each  Shoulder extensions green theraband x30  Lat pull down x4 plates  Isometric shoulder bicep hold at 90 degrees 10# b/l 10 sec holds x10  reps Horizontal shoulder ER/IR with 10# b/l 5x6 reps Arnold press 2# dumbbells 2x10 Shoulder pulley x3 mins Self cross friction massage x3 mins All at SBA cues for technique     5/15:  Self friction massage           Side lying ER with towel in pectoral area for shoulder positioning; 1# x 10           Prone shoulder extension x 10    PATIENT EDUCATION: Education details: HEP updated  Person educated: Patient Education method: Explanation, Corporate treasurer cues, Verbal cues, and Handouts Education comprehension: verbalized understanding and returned demonstration   HOME EXERCISE PROGRAM:  02/04/22 Access Code: [email or web address] URL: [email or web address] Date: 02/04/2022 Prepared by: Leota Jacobsen  Exercises - Doorway Pec Stretch at 90 Degrees Abduction  - 1 x daily - 7 x weekly - 1 sets - 5 reps - 15 hold - Doorway Pec Stretch at 120 Degrees Abduction  - 1 x daily - 7 x weekly - 1 sets - 5 reps - 15 hold - Prone Shoulder Flexion  - 1 x daily - 7 x weekly - 3 sets - 10 reps - 3 hold - Prone Shoulder Horizontal Abduction with Thumbs Up  - 1 x daily - 7 x weekly - 3 sets - 10 reps - 3 hold - Shoulder External Rotation Reactive Isometrics  - 1 x daily - 7 x weekly - 3 sets - 10 reps - Standing Single Arm Shoulder External Rotation in Abduction with Anchored Resistance  - 1 x daily - 7 x weekly -  3 sets - 10 reps Self friction massage           Side lying ER with towel in pectoral area for shoulder positioning; 1# x 10           Prone shoulder extension x 10  ASSESSMENT:  CLINICAL IMPRESSION: Patient performs all exercises well with no increased reports of  Rt bicipital tendonitis. Patient given updated HEP. Pt re instructed on cross friction techniques for self massage. Patient will continue to benefit from skilled PT services to decrease pain and work back towards unrestricted lifestyle and pain free performance of adls.  OBJECTIVE IMPAIRMENTS decreased activity tolerance,  decreased strength, impaired flexibility, impaired UE functional use, and pain.   ACTIVITY LIMITATIONS yard work and playing ball with his son, Rt side lying  .   PERSONAL FACTORS Time since onset of injury/illness/exacerbation are also affecting patient's functional outcome.    REHAB POTENTIAL: Good  CLINICAL DECISION MAKING: Stable/uncomplicated  EVALUATION COMPLEXITY: Low   GOALS: Goals reviewed with patient? No  SHORT TERM GOALS: Target date: 6529/2023    Pt to be I in HEP to allow his RT shoulder pain to be no greater than a 4/10 Baseline: Goal status: INITIAL  2.  Pt to be I in self friction massage to assist in decreasing pain Baseline:  Goal status: INITIAL  3.  Pt to be able to reach above shoulder ht with 5 # without increased pain  Baseline:  Goal status: INITIAL    LONG TERM GOALS: Target date: 03/04/2022  (Remove Blue Hyperlink)  Pt to be I in advanced HEP to decrease pain to no greater than a 3/10 Baseline:  Goal status: INITIAL  2.  Pt shoulder mm to be at least 5-/5 to be able to throw a baseball with his son without experiencing increased pain  Baseline:  Goal status: INITIAL  3.  PT to be able to sleep on his right side  Baseline:  Goal status: INITIAL   PLAN: PT FREQUENCY: 2x/week  PT DURATION: 4 weeks  PLANNED INTERVENTIONS: Therapeutic exercises, Patient/Family education, and Manual therapy  PLAN FOR NEXT SESSION: continue with chest openeing and rotator cuff strengthening, continue to load biceps isomerically, begin incorporating low load biceps arom.  Manual for pain relief  Jearlene Bridwell PT, DPT  02/04/2022, 9:02 AM

## 2022-02-08 ENCOUNTER — Telehealth (HOSPITAL_COMMUNITY): Payer: Self-pay

## 2022-02-08 NOTE — Telephone Encounter (Signed)
He will be on a business trip and will not be here - he  will return on 02/18/22

## 2022-02-10 ENCOUNTER — Ambulatory Visit (HOSPITAL_COMMUNITY): Payer: BC Managed Care – PPO

## 2022-02-11 ENCOUNTER — Ambulatory Visit (HOSPITAL_COMMUNITY): Payer: BC Managed Care – PPO

## 2022-02-13 ENCOUNTER — Ambulatory Visit
Admission: EM | Admit: 2022-02-13 | Discharge: 2022-02-13 | Disposition: A | Discharge status: Home / Self Care | Payer: BC Managed Care – PPO | Attending: Nurse Practitioner | Admitting: Nurse Practitioner

## 2022-02-13 DIAGNOSIS — J0100 Acute maxillary sinusitis, unspecified: Secondary | ICD-10-CM

## 2022-02-13 MED ORDER — FLUTICASONE PROPIONATE 50 MCG/ACT NA SUSP: 2.0000 | Freq: Every day | NASAL | 0 refills | Status: DC

## 2022-02-13 MED ORDER — AMOXICILLIN-POT CLAVULANATE 875-125 MG PO TABS: 1.0000 | ORAL_TABLET | Freq: Two times a day (BID) | ORAL | 0 refills | Status: DC

## 2022-02-13 MED ORDER — PROMETHAZINE-DM 6.25-15 MG/5ML PO SYRP: 5.0000 mL | ORAL_SOLUTION | Freq: Four times a day (QID) | ORAL | 0 refills | Status: DC | PRN

## 2022-02-13 NOTE — ED Provider Notes (Signed)
[facility]    CSN: [id number] Arrival date & time: 02/13/22  1106      History   Chief Complaint Chief Complaint  Patient presents with   Nasal Congestion   Cough    HPI Russell Chen is a 51 y.o. male.    Cough Patient presents for upper respiratory symptoms.  Symptoms have been present for more than 7 days.  He complains of cough, sinus pressure, nasal congestion, and chest congestion.  Patient states he developed a headache today.  And also states that his cough has gotten worse.  Cough is now productive.  He denies fever, chills, shortness of breath, wheezing, or GI symptoms.  He has been taking over-the-counter cough medicines for his symptoms.  Patient does have a history of seasonal allergies. Past Medical History:  Diagnosis Date   Allergy    seasonal allergies    Patient Active Problem List   Diagnosis Date Noted   Musculoskeletal pain 09/06/2021   Ganglion cyst 05/28/2012    Past Surgical History:  Procedure Laterality Date   FOREIGN BODY REMOVAL  05/25/2012   Procedure: FOREIGN BODY REMOVAL ADULT;  Surgeon: Carole Civil, MD;  Location: [facility];  Service: Orthopedics;  Laterality: Right;  right knee foreign body   MASS EXCISION  05/25/2012   Procedure: EXCISION MASS;  Surgeon: Carole Civil, MD;  Location: [facility];  Service: Orthopedics;  Laterality: Right;  right knee mass   VASECTOMY     WISDOM TOOTH EXTRACTION         Home Medications    Prior to Admission medications   Medication Sig Start Date End Date Taking? Authorizing Provider  amoxicillin-clavulanate (AUGMENTIN) 875-125 MG tablet Take 1 tablet by mouth every 12 (twelve) hours. 02/13/22  Yes Cem Kosman-Warren, Alda Lea, NP  fluticasone (FLONASE) 50 MCG/ACT nasal spray Place 2 sprays into both nostrils daily. 02/13/22  Yes Houa Nie-Warren, Alda Lea, NP  promethazine-dextromethorphan (PROMETHAZINE-DM) 6.25-15 MG/5ML syrup Take 5 mLs by mouth 4 (four) times daily as needed for cough.  02/13/22  Yes Ravin Bendall-Warren, Alda Lea, NP  LORATADINE PO Take 1 tablet by mouth daily at 6 (six) AM.    [provider]  Multiple Vitamins-Minerals (MULTIVITAMIN PO) Take 1 tablet by mouth daily.     [provider]  tamsulosin (FLOMAX) 0.4 MG CAPS capsule Take 1 capsule (0.4 mg total) by mouth at bedtime. 01/06/22   Kathyrn Drown, MD    Family History Family History  Problem Relation Age of Onset   Diabetes Father        pills   Cancer Sister        skin cancer   Cancer Maternal Grandmother        skin cancer   Heart disease Paternal Grandfather        BPH   Renal Disease Mother        obesity age 65   Colon polyps Neg Hx    Colon cancer Neg Hx    Esophageal cancer Neg Hx    Stomach cancer Neg Hx    Rectal cancer Neg Hx     Social History Social History   Tobacco Use   Smoking status: Former    Years: 0.50    Types: Cigarettes    Quit date: 05/22/1992    Years since quitting: 29.7   Smokeless tobacco: Never  Vaping Use   Vaping Use: Never used  Substance Use Topics   Alcohol use: No    Comment:  2 times per year   Drug use: No     Allergies   Patient has no known allergies.   Review of Systems Review of Systems  Respiratory:  Positive for cough.   PER HPI  Physical Exam Triage Vital Signs ED Triage Vitals  Enc Vitals Group     BP 02/13/22 1213 128/79     Pulse Rate 02/13/22 1213 74     Resp 02/13/22 1213 16     Temp 02/13/22 1213 98.7 F (37.1 C)     Temp Source 02/13/22 1213 Oral     SpO2 02/13/22 1213 95 %     Weight --      Height --      Head Circumference --      Peak Flow --      Pain Score 02/13/22 1214 2     Pain Loc --      Pain Edu? --      Excl. in [place]? --    No data found.  Updated Vital Signs BP 128/79 (BP Location: Right Arm)   Pulse 74   Temp 98.7 F (37.1 C) (Oral)   Resp 16   SpO2 95%   Visual Acuity Right Eye Distance:   Left Eye Distance:   Bilateral Distance:    Right Eye Near:   Left Eye  Near:    Bilateral Near:     Physical Exam Vitals and nursing note reviewed.  Constitutional:      General: He is not in acute distress.    Appearance: Normal appearance.  HENT:     Head: Normocephalic.     Right Ear: Tympanic membrane, ear canal and external ear normal.     Left Ear: Tympanic membrane, ear canal and external ear normal.     Nose: Congestion present.     Right Turbinates: Enlarged and swollen.     Left Turbinates: Enlarged and swollen.     Right Sinus: Maxillary sinus tenderness present. No frontal sinus tenderness.     Left Sinus: Maxillary sinus tenderness present. No frontal sinus tenderness.     Mouth/Throat:     Mouth: Mucous membranes are moist.     Pharynx: Posterior oropharyngeal erythema present.  Eyes:     Extraocular Movements: Extraocular movements intact.     Conjunctiva/sclera: Conjunctivae normal.     Pupils: Pupils are equal, round, and reactive to light.  Cardiovascular:     Rate and Rhythm: Normal rate and regular rhythm.     Pulses: Normal pulses.     Heart sounds: Normal heart sounds.  Pulmonary:     Effort: Pulmonary effort is normal.     Breath sounds: Normal breath sounds.  Abdominal:     General: Bowel sounds are normal.     Palpations: Abdomen is soft.  Musculoskeletal:     Cervical back: Normal range of motion.  Skin:    General: Skin is warm and dry.     Capillary Refill: Capillary refill takes less than 2 seconds.  Neurological:     General: No focal deficit present.     Mental Status: He is alert and oriented to person, place, and time.  Psychiatric:        Mood and Affect: Mood normal.        Behavior: Behavior normal.     UC Treatments / Results  Labs (all labs ordered are listed, but only abnormal results are displayed) Labs Reviewed - No data to display  EKG  Radiology No results found.  Procedures Procedures (including critical care time)  Medications Ordered in UC Medications - No data to  display  Initial Impression / Assessment and Plan / UC Course  I have reviewed the triage vital signs and the nursing notes.  Pertinent labs & imaging results that were available during my care of the patient were reviewed by me and considered in my medical decision making (see chart for details).  Patient presents with upper respiratory symptoms that been present for more than 7 days.  On exam, he has maxillary sinus tenderness, and cough.  We will treat patient with Augmentin given the duration of his symptoms and since he has been taking medications over-the-counter with no relief.  Also treated him with fluticasone to help with his nasal congestion.  Supportive care was recommended along with using over-the-counter pain relievers for pain or fever.  Patient advised to follow-up as needed. Final Clinical Impressions(s) / UC Diagnoses   Final diagnoses:  Acute maxillary sinusitis, recurrence not specified     Discharge Instructions      Take medication as prescribed. Increase fluids and get plenty of rest. May take over-the-counter ibuprofen or Tylenol as needed for pain, fever, general discomfort. Recommend using a humidifier at bedtime and during sleep. May use normal saline nasal spray to help with nasal congestion throughout the day. Follow-up with your primary care physician or in our clinic if symptoms do not improve.     ED Prescriptions     Medication Sig Dispense Auth. Provider   amoxicillin-clavulanate (AUGMENTIN) 875-125 MG tablet Take 1 tablet by mouth every 12 (twelve) hours. 14 tablet Lashawne Dura-Warren, Alda Lea, NP   fluticasone (FLONASE) 50 MCG/ACT nasal spray Place 2 sprays into both nostrils daily. 16 g Malli Falotico-Warren, Alda Lea, NP   promethazine-dextromethorphan (PROMETHAZINE-DM) 6.25-15 MG/5ML syrup Take 5 mLs by mouth 4 (four) times daily as needed for cough. 140 mL Justeen Hehr-Warren, Alda Lea, NP      PDMP not reviewed this encounter.   Tish Men, NP 02/13/22 1234

## 2022-02-13 NOTE — ED Triage Notes (Signed)
Pt reports cough, sinus pressure, nasal congestion and chest congestion x 1 week; headache started today. OTC cough meds gives some relief.

## 2022-02-13 NOTE — Discharge Instructions (Addendum)
Take medication as prescribed. Increase fluids and get plenty of rest. May take over-the-counter ibuprofen or Tylenol as needed for pain, fever, general discomfort. Recommend using a humidifier at bedtime and during sleep. May use normal saline nasal spray to help with nasal congestion throughout the day. Follow-up with your primary care physician or in our clinic if symptoms do not improve.

## 2022-02-16 ENCOUNTER — Encounter (HOSPITAL_COMMUNITY): Payer: BC Managed Care – PPO

## 2022-02-18 ENCOUNTER — Encounter (HOSPITAL_COMMUNITY): Payer: BC Managed Care – PPO

## 2022-02-23 ENCOUNTER — Encounter (HOSPITAL_COMMUNITY): Payer: BC Managed Care – PPO | Admitting: Physical Therapy

## 2022-02-24 ENCOUNTER — Encounter (HOSPITAL_COMMUNITY): Payer: Self-pay | Admitting: Physical Therapy

## 2022-02-24 NOTE — Therapy (Unsigned)
[facility] [address] [place], [place], [address] Phone: [phone]   Fax:  [phone]  Patient Details  Name: WORTHINGTON CRUZAN MRN: [id number] Date of Birth: [birth year] Referring Provider:  No ref. provider found  Encounter Date: 02/24/2022   PHYSICAL THERAPY DISCHARGE SUMMARY  Visits from Start of Care: 2  Current functional level related to goals / functional outcomes: No pain    Remaining deficits: none   Education / Equipment: HEP   Patient agrees to discharge. Patient goals were met. Patient is being discharged due to meeting the stated rehab goals.  Rayetta Humphrey, PT CLT [phone]  02/24/2022, 8:14 AM  [facility] [place], [place], [address] Phone: [phone]   Fax:  [phone]

## 2022-02-25 ENCOUNTER — Encounter (HOSPITAL_COMMUNITY): Payer: BC Managed Care – PPO

## 2022-05-13 ENCOUNTER — Telehealth: Payer: Self-pay | Admitting: Family Medicine

## 2022-05-13 NOTE — Telephone Encounter (Signed)
Active labs from 01/06/22 PSA and LIPID. Last completed labs 11/23/21 HIV, HEP C, PSA, BMET, HEPATIC, LIPID and CBC. Please advise. Thank you

## 2022-05-13 NOTE — Telephone Encounter (Signed)
Patient has a 6 month follow up for 07/08/22 he is asking if blood work is due before this appt.  CB# [phone]

## 2022-05-16 NOTE — Telephone Encounter (Signed)
Pt returned call and verbalized understanding  

## 2022-05-16 NOTE — Telephone Encounter (Signed)
The only test that I would recommend at this point in time would be lipid and PSA Should be noted that these were ordered back in April and I believe they should still be good thank you  His other blood work does not need to be repeated unless he is requesting additional labs beyond lipid and PSA thank you

## 2022-05-16 NOTE — Telephone Encounter (Signed)
Left message for a return call

## 2022-06-28 ENCOUNTER — Other Ambulatory Visit (HOSPITAL_BASED_OUTPATIENT_CLINIC_OR_DEPARTMENT_OTHER): Payer: Self-pay

## 2022-06-28 MED ORDER — FLUARIX QUADRIVALENT 0.5 ML IM SUSY
PREFILLED_SYRINGE | INTRAMUSCULAR | 0 refills | Status: DC
  Filled 2022-06-28: qty 0.5, 1d supply, fill #0

## 2022-07-05 DIAGNOSIS — E785 Hyperlipidemia, unspecified: Secondary | ICD-10-CM | POA: Diagnosis not present

## 2022-07-05 DIAGNOSIS — Z125 Encounter for screening for malignant neoplasm of prostate: Secondary | ICD-10-CM | POA: Diagnosis not present

## 2022-07-06 LAB — LIPID PANEL
Chol/HDL Ratio: 4.6 ratio (ref 0.0–5.0)
Cholesterol, Total: 196 mg/dL (ref 100–199)
HDL: 43 mg/dL (ref >39)
LDL Chol Calc (NIH): 138 mg/dL — ABNORMAL HIGH (ref 0–99)
Triglycerides: 82 mg/dL (ref 0–149)
VLDL Cholesterol Cal: 15 mg/dL (ref 5–40)

## 2022-07-06 LAB — PSA: Prostate Specific Ag, Serum: 0.7 ng/mL (ref 0.0–4.0)

## 2022-07-08 ENCOUNTER — Ambulatory Visit: Payer: Self-pay | Admitting: Family Medicine

## 2022-07-09 ENCOUNTER — Encounter: Payer: Self-pay | Admitting: Family Medicine

## 2022-07-09 DIAGNOSIS — E785 Hyperlipidemia, unspecified: Secondary | ICD-10-CM

## 2022-07-11 NOTE — Telephone Encounter (Signed)
Nurses Please order lipid profile I would recommend that Russell Chen may reschedule his visit to late January and do his lipid profile before that visit. (This is keeping with his desire to try to work hard on diet and exercise and read look at cholesterol profile) Please order the lab, please send notification to Russell Chen so he can reschedule until late January early February thank you

## 2022-07-15 ENCOUNTER — Ambulatory Visit: Payer: BC Managed Care – PPO | Admitting: Family Medicine

## 2022-08-22 DIAGNOSIS — D225 Melanocytic nevi of trunk: Secondary | ICD-10-CM | POA: Diagnosis not present

## 2022-08-22 DIAGNOSIS — Z1283 Encounter for screening for malignant neoplasm of skin: Secondary | ICD-10-CM | POA: Diagnosis not present

## 2022-08-22 DIAGNOSIS — D485 Neoplasm of uncertain behavior of skin: Secondary | ICD-10-CM | POA: Diagnosis not present

## 2022-08-22 DIAGNOSIS — D2272 Melanocytic nevi of left lower limb, including hip: Secondary | ICD-10-CM | POA: Diagnosis not present

## 2022-09-26 DIAGNOSIS — D485 Neoplasm of uncertain behavior of skin: Secondary | ICD-10-CM | POA: Diagnosis not present

## 2022-09-26 DIAGNOSIS — D2272 Melanocytic nevi of left lower limb, including hip: Secondary | ICD-10-CM | POA: Diagnosis not present

## 2022-09-26 DIAGNOSIS — D225 Melanocytic nevi of trunk: Secondary | ICD-10-CM | POA: Diagnosis not present

## 2022-10-13 DIAGNOSIS — D485 Neoplasm of uncertain behavior of skin: Secondary | ICD-10-CM | POA: Diagnosis not present

## 2022-10-13 DIAGNOSIS — L988 Other specified disorders of the skin and subcutaneous tissue: Secondary | ICD-10-CM | POA: Diagnosis not present

## 2022-10-19 DIAGNOSIS — E785 Hyperlipidemia, unspecified: Secondary | ICD-10-CM | POA: Diagnosis not present

## 2022-10-20 LAB — LIPID PANEL
Chol/HDL Ratio: 3.9 ratio (ref 0.0–5.0)
Cholesterol, Total: 188 mg/dL (ref 100–199)
HDL: 48 mg/dL (ref >39)
LDL Chol Calc (NIH): 130 mg/dL — ABNORMAL HIGH (ref 0–99)
Triglycerides: 55 mg/dL (ref 0–149)
VLDL Cholesterol Cal: 10 mg/dL (ref 5–40)

## 2022-10-25 ENCOUNTER — Ambulatory Visit: Payer: BC Managed Care – PPO | Admitting: Family Medicine

## 2022-10-25 ENCOUNTER — Other Ambulatory Visit: Payer: Self-pay

## 2022-10-25 VITALS — BP 114/78 | Wt 194.6 lb

## 2022-10-25 DIAGNOSIS — E785 Hyperlipidemia, unspecified: Secondary | ICD-10-CM | POA: Diagnosis not present

## 2022-10-25 DIAGNOSIS — Z125 Encounter for screening for malignant neoplasm of prostate: Secondary | ICD-10-CM

## 2022-10-25 DIAGNOSIS — L989 Disorder of the skin and subcutaneous tissue, unspecified: Secondary | ICD-10-CM

## 2022-10-25 MED ORDER — TAMSULOSIN HCL 0.4 MG PO CAPS: 0.4000 mg | ORAL_CAPSULE | Freq: Every day | ORAL | 3 refills | Status: DC

## 2022-10-25 NOTE — Progress Notes (Signed)
   Subjective:    Patient ID: Russell Chen, male    DOB: 09-Mar-1971, 52 y.o.   MRN: [id number]  HPI Patient arrives today for 6 mouth visit to discuss lab work.  he is doing good trying to eat healthy and stay physically active he is lifting weights several days per week and doing some cardio The 10-year ASCVD risk score (Arnett DK, et al., 2019) is: 2.9%   Values used to calculate the score:     Age: 35 years     Sex: Male     Is Non-Hispanic African American: No     Diabetic: No     Tobacco smoker: No     Systolic Blood Pressure: 335 mmHg     Is BP treated: No     HDL Cholesterol: 48 mg/dL     Total Cholesterol: 188 mg/dL  Has some right shoulder arthritis he would tolerate it as best he can  Patient also had skin mole biopsy which was a atypical melanocyte but this is doing well overall follows with Dr. Nevada Crane every 6 months every 12 months  Review of Systems     Objective:   Physical Exam General-in no acute distress Eyes-no discharge Lungs-respiratory rate normal, CTA CV-no murmurs,RRR Extremities skin warm dry no edema Neuro grossly normal Behavior normal, alert   Previous PSA does show improvement     Assessment & Plan:  Very nice patient Hyperlipidemia-his risk for heart disease 2.9% Shared discussion at this time patient defers on coronary calcium-I agree with his conclusion.  Patient to do lab work before his follow-up visit later this spring for wellness  Continue to eat healthy stay physically active.

## 2023-01-03 DIAGNOSIS — L989 Disorder of the skin and subcutaneous tissue, unspecified: Secondary | ICD-10-CM | POA: Diagnosis not present

## 2023-01-03 DIAGNOSIS — Z125 Encounter for screening for malignant neoplasm of prostate: Secondary | ICD-10-CM | POA: Diagnosis not present

## 2023-01-04 LAB — BASIC METABOLIC PANEL (7)
BUN/Creatinine Ratio: 13 (ref 9–20)
BUN: 15 mg/dL (ref 6–24)
CO2: 23 mmol/L (ref 20–29)
Chloride: 106 mmol/L (ref 96–106)
Creatinine, Ser: 1.15 mg/dL (ref 0.76–1.27)
Glucose: 95 mg/dL (ref 70–99)
Potassium: 4.8 mmol/L (ref 3.5–5.2)
Sodium: 144 mmol/L (ref 134–144)
eGFR: 77 mL/min/{1.73_m2} (ref >59)

## 2023-01-04 LAB — CBC WITH DIFFERENTIAL/PLATELET
Basophils Absolute: 0.1 10*3/uL (ref 0.0–0.2)
Basos: 1 %
EOS (ABSOLUTE): 0.2 10*3/uL (ref 0.0–0.4)
Eos: 5 %
Hematocrit: 44.6 % (ref 37.5–51.0)
Hemoglobin: 14.3 g/dL (ref 13.0–17.7)
Immature Grans (Abs): 0 10*3/uL (ref 0.0–0.1)
Immature Granulocytes: 0 %
Lymphocytes Absolute: 1.2 10*3/uL (ref 0.7–3.1)
Lymphs: 31 %
MCH: 27.1 pg (ref 26.6–33.0)
MCHC: 32.1 g/dL (ref 31.5–35.7)
MCV: 85 fL (ref 79–97)
Monocytes Absolute: 0.4 10*3/uL (ref 0.1–0.9)
Monocytes: 11 %
Neutrophils Absolute: 2 10*3/uL (ref 1.4–7.0)
Neutrophils: 52 %
Platelets: 182 10*3/uL (ref 150–450)
RBC: 5.28 x10E6/uL (ref 4.14–5.80)
RDW: 13.7 % (ref 11.6–15.4)
WBC: 3.8 10*3/uL (ref 3.4–10.8)

## 2023-01-04 LAB — PSA: Prostate Specific Ag, Serum: 0.7 ng/mL (ref 0.0–4.0)

## 2023-01-09 ENCOUNTER — Encounter: Payer: BC Managed Care – PPO | Admitting: Family Medicine

## 2023-01-11 ENCOUNTER — Ambulatory Visit (INDEPENDENT_AMBULATORY_CARE_PROVIDER_SITE_OTHER): Payer: BC Managed Care – PPO | Admitting: Family Medicine

## 2023-01-11 VITALS — BP 117/78 | HR 70 | Ht 69.0 in | Wt 195.4 lb

## 2023-01-11 DIAGNOSIS — E785 Hyperlipidemia, unspecified: Secondary | ICD-10-CM

## 2023-01-11 DIAGNOSIS — N401 Enlarged prostate with lower urinary tract symptoms: Secondary | ICD-10-CM | POA: Diagnosis not present

## 2023-01-11 DIAGNOSIS — Z0000 Encounter for general adult medical examination without abnormal findings: Secondary | ICD-10-CM

## 2023-01-11 DIAGNOSIS — Z0001 Encounter for general adult medical examination with abnormal findings: Secondary | ICD-10-CM

## 2023-01-11 NOTE — Patient Instructions (Signed)
Hi Russell Chen As we discussed overall you are doing well.  Continue your Flomax daily.  Should you have any ongoing troubles problems or questions let me know.  Please do a wellness visit again in 1 years time.  Charlton Amor MD

## 2023-01-11 NOTE — Progress Notes (Signed)
   Subjective:    Patient ID: Russell Chen, male    DOB: 1971/03/29, 52 y.o.   MRN: [id number]  HPI The patient comes in today for a wellness visit. He is physically active with exercise walking and lifting weights Very nice patient Does a good job taking care of himself Does try to eat healthy on a regular basis Pleas Koch a fair amount with his work This is been stressful but he is handling it well Denies being depressed    A review of their health history was completed.  A review of medications was also completed.  Any needed refills; no  Eating habits: eating healthy  Falls/  MVA accidents in past few months: no  Regular exercise: some lift  weights/walk 3x week  Specialist pt sees on regular basis: no  Preventative health issues were discussed.   Additional concerns: no concerns or issues today    Review of Systems     Objective:   Physical Exam General-in no acute distress Eyes-no discharge Lungs-respiratory rate normal, CTA CV-no murmurs,RRR Extremities skin warm dry no edema Neuro grossly normal Behavior normal, alert Prostate is enlarged but this is on the mild side No hard nodules        Assessment & Plan:  1. Well adult exam Adult wellness-complete.wellness physical was conducted today. Importance of diet and exercise were discussed in detail.  Importance of stress reduction and healthy living were discussed.  In addition to this a discussion regarding safety was also covered.  We also reviewed over immunizations and gave recommendations regarding current immunization needed for age.   In addition to this additional areas were also touched on including: Preventative health exams needed:  Colonoscopy next colonoscopy 2032 Patient doing a good job of eating healthy Avoids alcohol Does not smoke  Patient was advised yearly wellness exam   2. Benign prostatic hyperplasia with lower urinary tract symptoms, symptom details unspecified Takes  Flomax on a regular basis This is cut down the amount of urination at nighttime but he still has some urgency issues for now continue Flomax If his stream ever becomes severely inhibited may need to have urology consult PSA looks good Prostate mildly enlarged but no hard nodules   3. Hyperlipidemia, unspecified hyperlipidemia type Slight elevation of LDL but not enough to be on a statin ASCVD risk is less than 3% I would not recommend statins at this point Healthy eating is the best approach I told the patient if he is interested in rechecking lipid profile in the fall time we can do so but otherwise I would recommend doing this again by next years wellness  Shingles vaccine recommended but he could wait till mid 53s to get this completed

## 2023-03-09 ENCOUNTER — Ambulatory Visit
Admission: EM | Admit: 2023-03-09 | Discharge: 2023-03-09 | Disposition: A | Discharge status: Home / Self Care | Payer: PRIVATE HEALTH INSURANCE | Attending: Nurse Practitioner | Admitting: Nurse Practitioner

## 2023-03-09 DIAGNOSIS — J069 Acute upper respiratory infection, unspecified: Secondary | ICD-10-CM | POA: Insufficient documentation

## 2023-03-09 DIAGNOSIS — Z1152 Encounter for screening for COVID-19: Secondary | ICD-10-CM | POA: Insufficient documentation

## 2023-03-09 LAB — POCT RAPID STREP A (OFFICE): Rapid Strep A Screen: NEGATIVE

## 2023-03-09 MED ORDER — BENZONATATE 100 MG PO CAPS: 100.0000 mg | ORAL_CAPSULE | Freq: Three times a day (TID) | ORAL | 0 refills | Status: DC

## 2023-03-09 NOTE — ED Provider Notes (Signed)
[facility]    CSN: [id number] Arrival date & time: 03/09/23  0802      History   Chief Complaint Chief Complaint  Patient presents with   Cough   Sore Throat    HPI Russell Chen is a 52 y.o. male.   Strain of dry cough, stuffy nose, postnasal drainage and sore throat, sinus pressure and headache, left ear pressure, decreased appetite, and fatigue.  He denies fever, body aches or chills, shortness of breath or chest pain, runny nose, abdominal pain, nausea/vomiting, diarrhea, loss of taste or smell, and new rash.  No known sick contacts.  He takes loratadine daily for allergies and has taken acetaminophen last night which seems to help a little bit.    Past Medical History:  Diagnosis Date   Allergy    seasonal allergies    Patient Active Problem List   Diagnosis Date Noted   Musculoskeletal pain 09/06/2021   Ganglion cyst 05/28/2012    Past Surgical History:  Procedure Laterality Date   FOREIGN BODY REMOVAL  05/25/2012   Procedure: FOREIGN BODY REMOVAL ADULT;  Surgeon: Vickki Hearing, MD;  Location: [facility];  Service: Orthopedics;  Laterality: Right;  right knee foreign body   MASS EXCISION  05/25/2012   Procedure: EXCISION MASS;  Surgeon: Vickki Hearing, MD;  Location: [facility];  Service: Orthopedics;  Laterality: Right;  right knee mass   VASECTOMY     WISDOM TOOTH EXTRACTION         Home Medications    Prior to Admission medications   Medication Sig Start Date End Date Taking? Authorizing Provider  Multiple Vitamins-Minerals (MULTIVITAMIN PO) Take 1 tablet by mouth daily.    Yes [provider]  tamsulosin (FLOMAX) 0.4 MG CAPS capsule Take 1 capsule (0.4 mg total) by mouth at bedtime. 10/25/22  Yes Luking, Scott A, MD  LORATADINE PO Take 1 tablet by mouth daily at 6 (six) AM.    [provider]    Family History Family History  Problem Relation Age of Onset   Diabetes Father        pills   Cancer Sister        skin  cancer   Cancer Maternal Grandmother        skin cancer   Heart disease Paternal Grandfather        BPH   Renal Disease Mother        obesity age 13   Colon polyps Neg Hx    Colon cancer Neg Hx    Esophageal cancer Neg Hx    Stomach cancer Neg Hx    Rectal cancer Neg Hx     Social History Social History   Tobacco Use   Smoking status: Former    Years: .5    Types: Cigarettes    Quit date: 05/22/1992    Years since quitting: 30.8   Smokeless tobacco: Never  Vaping Use   Vaping Use: Never used  Substance Use Topics   Alcohol use: No    Comment: 2 times per year   Drug use: No     Allergies   Patient has no known allergies.   Review of Systems Review of Systems Per HPI  Physical Exam Triage Vital Signs ED Triage Vitals [03/09/23 0812]  Enc Vitals Group     BP (!) 141/85     Pulse Rate 77     Resp 16     Temp 98.7 F (37.1 C)  Temp Source Oral     SpO2 94 %     Weight      Height      Head Circumference      Peak Flow      Pain Score 8     Pain Loc      Pain Edu?      Excl. in [place]?    No data found.  Updated Vital Signs BP (!) 141/85 (BP Location: Right Arm)   Pulse 77   Temp 98.7 F (37.1 C) (Oral)   Resp 16   SpO2 94%   Visual Acuity Right Eye Distance:   Left Eye Distance:   Bilateral Distance:    Right Eye Near:   Left Eye Near:    Bilateral Near:     Physical Exam Vitals and nursing note reviewed.  Constitutional:      General: He is not in acute distress.    Appearance: Normal appearance. He is not ill-appearing or toxic-appearing.  HENT:     Head: Normocephalic and atraumatic.     Right Ear: Ear canal and external ear normal. No drainage, swelling or tenderness. A middle ear effusion is present. Tympanic membrane is not erythematous.     Left Ear: Tympanic membrane, ear canal and external ear normal. No drainage, swelling or tenderness.  No middle ear effusion. Tympanic membrane is not erythematous.     Nose: No congestion or  rhinorrhea.     Mouth/Throat:     Mouth: Mucous membranes are moist.     Pharynx: Oropharynx is clear. Posterior oropharyngeal erythema present. No oropharyngeal exudate.     Tonsils: No tonsillar exudate. 0 on the right. 0 on the left.  Eyes:     General: No scleral icterus.    Extraocular Movements: Extraocular movements intact.  Cardiovascular:     Rate and Rhythm: Normal rate and regular rhythm.  Pulmonary:     Effort: Pulmonary effort is normal. No respiratory distress.     Breath sounds: Normal breath sounds. No wheezing, rhonchi or rales.  Abdominal:     General: Abdomen is flat. Bowel sounds are normal. There is no distension.     Palpations: Abdomen is soft.  Musculoskeletal:     Cervical back: Normal range of motion and neck supple.  Lymphadenopathy:     Cervical: No cervical adenopathy.  Skin:    General: Skin is warm and dry.     Coloration: Skin is not jaundiced or pale.     Findings: No erythema or rash.  Neurological:     Mental Status: He is alert and oriented to person, place, and time.     Motor: No weakness.  Psychiatric:        Behavior: Behavior is cooperative.      UC Treatments / Results  Labs (all labs ordered are listed, but only abnormal results are displayed) Labs Reviewed  SARS CORONAVIRUS 2 (TAT 6-24 HRS)  POCT RAPID STREP A (OFFICE)    EKG   Radiology No results found.  Procedures Procedures (including critical care time)  Medications Ordered in UC Medications - No data to display  Initial Impression / Assessment and Plan / UC Course  I have reviewed the triage vital signs and the nursing notes.  Pertinent labs & imaging results that were available during my care of the patient were reviewed by me and considered in my medical decision making (see chart for details).   Patient is well-appearing, normotensive, afebrile, not tachycardic, not tachypneic,  oxygenating well on room air.    1. Viral URI with cough 2. Encounter for  screening for COVID-19 Suspect viral etiology Vitals and exam today are reassuring Rapid strep throat test negative COVID-19 test is pending Supportive care discussed with patient Start Tessalon Perles, guaifenesin, hydration, saline rinses Seek care for persistent/worsening symptoms despite treatment Note given for work  The patient was given the opportunity to ask questions.  All questions answered to their satisfaction.  The patient is in agreement to this plan.    Final Clinical Impressions(s) / UC Diagnoses   Final diagnoses:  Viral URI with cough  Encounter for screening for COVID-19     Discharge Instructions      You have a viral upper respiratory infection.  Symptoms should improve over the next week to 10 days.  If you develop chest pain or shortness of breath, go to the emergency room.  We have tested you today for COVID-19.  You will see the results in Mychart and we will call you with positive results.  Please stay home and isolate until you are aware of the results.    Some things that can make you feel better are: - Increased rest - Increasing fluid with water/sugar free electrolytes - Acetaminophen as needed for fever/pain - Salt water gargling, chloraseptic spray and throat lozenges for sore throat - OTC guaifenesin (Mucinex) 600 mg twice daily for congestion - Saline sinus flushes or a neti pot - Humidifying the air -Tessalon Perles every 8 hours as needed for dry cough     ED Prescriptions   None    PDMP not reviewed this encounter.   Valentino Nose, NP 03/09/23 [phone]

## 2023-03-09 NOTE — ED Triage Notes (Signed)
Cough, sore throat, headache that started  yesterday. Taking OTC allergy medication.

## 2023-03-09 NOTE — Discharge Instructions (Signed)
You have a viral upper respiratory infection.  Symptoms should improve over the next week to 10 days.  If you develop chest pain or shortness of breath, go to the emergency room.  We have tested you today for COVID-19.  You will see the results in Mychart and we will call you with positive results.  Please stay home and isolate until you are aware of the results.    Some things that can make you feel better are: - Increased rest - Increasing fluid with water/sugar free electrolytes - Acetaminophen as needed for fever/pain - Salt water gargling, chloraseptic spray and throat lozenges for sore throat - OTC guaifenesin (Mucinex) 600 mg twice daily for congestion - Saline sinus flushes or a neti pot - Humidifying the air -Tessalon Perles every 8 hours as needed for dry cough 

## 2023-03-10 LAB — SARS CORONAVIRUS 2 (TAT 6-24 HRS): SARS Coronavirus 2: POSITIVE — AB

## 2023-06-29 ENCOUNTER — Other Ambulatory Visit: Payer: Self-pay | Admitting: Nurse Practitioner

## 2023-06-29 ENCOUNTER — Encounter: Payer: Self-pay | Admitting: Family Medicine

## 2023-06-29 DIAGNOSIS — T782XXA Anaphylactic shock, unspecified, initial encounter: Secondary | ICD-10-CM

## 2023-06-29 MED ORDER — EPINEPHRINE 0.3 MG/0.3ML IJ SOAJ: 0.3000 mg | INTRAMUSCULAR | 2 refills | Status: DC | PRN

## 2023-06-29 NOTE — Telephone Encounter (Signed)
Spoke with patient by phone. Issues occur only with commercially prepared foods by catering and restaurants. Does not occur everywhere. No problems have been noted with food prepared at home. States he is fine now.  Take Benadryl now. Epi pen sent in to have on hand for severe reactions. Keep dose of Benadryl with the kit. Referral will be sent to local allergist for evaluation. Patient to call 911 if anaphylaxis.  He agrees with this plan.

## 2023-08-07 ENCOUNTER — Ambulatory Visit (INDEPENDENT_AMBULATORY_CARE_PROVIDER_SITE_OTHER): Payer: PRIVATE HEALTH INSURANCE | Admitting: Internal Medicine

## 2023-08-07 ENCOUNTER — Encounter: Payer: Self-pay | Admitting: Internal Medicine

## 2023-08-07 VITALS — BP 127/72 | HR 83 | Temp 99.1°F | Ht 70.28 in | Wt 193.8 lb

## 2023-08-07 DIAGNOSIS — T7800XD Anaphylactic reaction due to unspecified food, subsequent encounter: Secondary | ICD-10-CM | POA: Diagnosis not present

## 2023-08-07 DIAGNOSIS — J3089 Other allergic rhinitis: Secondary | ICD-10-CM | POA: Diagnosis not present

## 2023-08-07 DIAGNOSIS — T7800XA Anaphylactic reaction due to unspecified food, initial encounter: Secondary | ICD-10-CM

## 2023-08-07 MED ORDER — EPINEPHRINE 0.3 MG/0.3ML IJ SOAJ: 0.3000 mg | INTRAMUSCULAR | 1 refills | Status: DC | PRN

## 2023-08-07 MED ORDER — LORATADINE 10 MG PO TABS: 10.0000 mg | ORAL_TABLET | Freq: Every day | ORAL | 5 refills | Status: AC

## 2023-08-07 NOTE — Progress Notes (Signed)
NEW PATIENT  Date of Service/Encounter:  08/07/23  Consult requested by: Babs Sciara, MD   Subjective:   Russell Chen (DOB: 08-17-1971) is a 52 y.o. male who presents to the clinic on 08/07/2023 with a chief complaint of Allergy Testing (Had an episode in October throat felt like it was going to close up. When eating  certain food.) .    History obtained from: chart review and patient.   Food Reactions:  Started about 2-3 years ago. Noted initially with asian food like hibachi chicken or beef, where he could not swallow and had tearing of eyes.  Then with steak in [place] and a chicken burrito at [employer].   Most recently, October 10th was at a company event and had beef stew, green beans, mac and cheese.  Within a few bites, felt like he couldn't swallow, restricted breathing, vomiting, chest tightness, mucous in throat.  He then drove home but threw up on the side of the road. Felt fine after 2 hours so did not go to urgent care/ER  Denies any aerobic activity prior to or after the meal.  Eaten beef/chicken/wheat/dairy/green beans since then at home and did fine.   Tick bite possibly over 2 years ago  Rhinitis:  Started since he was young.  Symptoms include: nasal congestion, rhinorrhea, post nasal drainage, and sneezing  Occurs seasonally-Spring/Fall Potential triggers: not sure  Treatments tried:  Claritin PRN  Previous allergy testing: no History of sinus surgery: no Nonallergic triggers: none    Past Medical History: Past Medical History:  Diagnosis Date   Allergy    seasonal allergies    Past Surgical History: Past Surgical History:  Procedure Laterality Date   FOREIGN BODY REMOVAL  05/25/2012   Procedure: FOREIGN BODY REMOVAL ADULT;  Surgeon: Vickki Hearing, MD;  Location: [facility];  Service: Orthopedics;  Laterality: Right;  right knee foreign body   MASS EXCISION  05/25/2012   Procedure: EXCISION MASS;  Surgeon: Vickki Hearing, MD;  Location:  [facility];  Service: Orthopedics;  Laterality: Right;  right knee mass   VASECTOMY     WISDOM TOOTH EXTRACTION      Family History: Family History  Problem Relation Age of Onset   Diabetes Father        pills   Cancer Sister        skin cancer   Cancer Maternal Grandmother        skin cancer   Heart disease Paternal Grandfather        BPH   Renal Disease Mother        obesity age 34   Colon polyps Neg Hx    Colon cancer Neg Hx    Esophageal cancer Neg Hx    Stomach cancer Neg Hx    Rectal cancer Neg Hx     Social History:  Flooring in bedroom: laminate Pets: none Tobacco use/exposure: none Job: Administrator, [employer]   Medication List:  Allergies as of 08/07/2023   No Known Allergies      Medication List        Accurate as of August 07, 2023  2:29 PM. If you have any questions, ask your nurse or doctor.          benzonatate 100 MG capsule Commonly known as: TESSALON Take 1 capsule (100 mg total) by mouth every 8 (eight) hours. Do not take with alcohol or while driving or operating heavy machinery.  May cause drowsiness.   EPINEPHrine  0.3 mg/0.3 mL Soaj injection Commonly known as: EPI-PEN Inject 0.3 mg into the muscle as needed for anaphylaxis.   LORATADINE PO Take 1 tablet by mouth daily at 6 (six) AM.   MULTIVITAMIN PO Take 1 tablet by mouth daily.   tamsulosin 0.4 MG Caps capsule Commonly known as: FLOMAX Take 1 capsule (0.4 mg total) by mouth at bedtime.         REVIEW OF SYSTEMS: Pertinent positives and negatives discussed in HPI.   Objective:   Physical Exam: BP 127/72   Pulse 83   Temp 99.1 F (37.3 C) (Temporal)   Ht 5' 10.28" (1.785 m)   Wt 193 lb 12.8 oz (87.9 kg)   SpO2 97%   BMI 27.59 kg/m  Body mass index is 27.59 kg/m. GEN: alert, well developed HEENT: clear conjunctiva, nose with + mild inferior turbinate hypertrophy, pink nasal mucosa, slight clear rhinorrhea, no cobblestoning HEART: regular rate and rhythm, no  murmur LUNGS: clear to auscultation bilaterally, no coughing, unlabored respiration ABDOMEN: soft, non distended  SKIN: no rashes or lesions  Reviewed:  06/29/2023: concern for allergic reaction to beef stew, green beans, mac and cheese.  Developed trouble swallowing, restricted breathing, chest tightness, vomiting.    01/11/2023: seen by Dr Gerda Diss for PMH of BPH and HLD. On Flomax.   02/13/2022: seen in urgent care for acute maxillary sinusitis. Treated with Augmentin.  Assessment:   1. Other allergic rhinitis   2. Allergy with anaphylaxis due to food     Plan/Recommendations:  Other Allergic Rhinitis: - Due to turbinate hypertrophy, seasonal symptoms and unresponsive to over the counter meds, will perform skin testing to identify aeroallergen triggers.   - Use nasal saline rinses before nose sprays such as with Neilmed Sinus Rinse.  Use distilled water.   - Use Claritin 10 mg daily as needed for runny nose, sneezing, itchy watery eyes.  Hold anti histamines 3 days prior to the testing.   Food Allergy:  - Unclear trigger for reactions.  Has had anaphylaxis symptoms after eating certain foods (chicken burrito, hibachi chicken/beef, steak, beef stew, green beans, mac and cheese) but when eaten chicken/beef at home, he does fine.  Has had hx of tick bites so will check alpha gal. Will also check tryptase due to unclear etiology for these reactions.  - for SKIN only reaction, okay to take Benadryl 25mg  capsules every 6 hours as needed - for SKIN + ANY additional symptoms, OR IF concern for LIFE THREATENING reaction = Epipen Autoinjector EpiPen 0.3 mg. - If using Epinephrine autoinjector, call 911 or go to the ER.    Follow up at 10 AM 11/25 for 1-68, chicken, beef, green beans.     No follow-ups on file.  Alesia Morin, MD [facility]

## 2023-08-07 NOTE — Patient Instructions (Addendum)
Other Allergic Rhinitis: - Use Claritin 10 mg daily as needed for runny nose, sneezing, itchy watery eyes.  Hold anti histamines 3 days prior to the testing.   Food Allergy:  - will check tryptase level also  - for SKIN only reaction, okay to take Benadryl 25mg  capsules every 6 hours as needed - for SKIN + ANY additional symptoms, OR IF concern for LIFE THREATENING reaction = Epipen Autoinjector EpiPen 0.3 mg. - If using Epinephrine autoinjector, call 911 or go to the ER.    Follow up at 10 AM 11/25 for 1-68, chicken, beef

## 2023-08-10 LAB — ALPHA-GAL PANEL
Allergen Lamb IgE: 0.1 kU/L — AB
Beef IgE: <0.1 kU/L
IgE (Immunoglobulin E), Serum: 32 [IU]/mL (ref 6–495)
O215-IgE Alpha-Gal: <0.1 kU/L
Pork IgE: <0.1 kU/L

## 2023-08-10 LAB — ALLERGEN, GREEN BEAN, RF315: Allergen Green Bean IgE: <0.1 kU/L

## 2023-08-10 LAB — TRYPTASE: Tryptase: 8.5 ug/L (ref 2.2–13.2)

## 2023-08-10 LAB — ALLERGEN, CHICKEN F83: Chicken IgE: <0.1 kU/L

## 2023-08-14 ENCOUNTER — Ambulatory Visit (INDEPENDENT_AMBULATORY_CARE_PROVIDER_SITE_OTHER): Payer: PRIVATE HEALTH INSURANCE | Admitting: Internal Medicine

## 2023-08-14 DIAGNOSIS — J301 Allergic rhinitis due to pollen: Secondary | ICD-10-CM

## 2023-08-14 DIAGNOSIS — T7800XA Anaphylactic reaction due to unspecified food, initial encounter: Secondary | ICD-10-CM

## 2023-08-14 DIAGNOSIS — T7800XD Anaphylactic reaction due to unspecified food, subsequent encounter: Secondary | ICD-10-CM | POA: Diagnosis not present

## 2023-08-14 NOTE — Progress Notes (Signed)
FOLLOW UP Date of Service/Encounter:  08/14/23   Subjective:  Russell Chen (DOB: 1971/02/28) is a 52 y.o. male who [facility] for follow up for skin testing.   History obtained from: chart review and patient.  Anti histamines held.   Past Medical History: Past Medical History:  Diagnosis Date   Allergy    seasonal allergies    Objective:  There were no vitals taken for this visit. There is no height or weight on file to calculate BMI. Physical Exam: GEN: alert, well developed HEENT: clear conjunctiva, MMM LUNGS: unlabored respiration  Skin Testing:  Skin prick testing was placed, which includes aeroallergens/foods, histamine control, and saline control.  Verbal consent was obtained prior to placing test.  Patient tolerated procedure well.  Allergy testing results were read and interpreted by myself, documented by clinical staff. Adequate positive and negative control.  Positive results to:  Results discussed with patient/family.  Airborne Adult Perc - 08/14/23 0949     Time Antigen Placed 5409    Allergen Manufacturer Waynette Buttery    Location Back    Number of Test 55    1. Control-Buffer 50% Glycerol Negative    2. Control-Histamine 3+    3. Bahia Negative    4. [place] 3+    5. Johnson Negative    6. Kentucky Blue 3+    7. Meadow Fescue 3+    8. Perennial Rye 3+    9. Timothy 3+    10. Ragweed Mix Negative    11. Cocklebur Negative    12. Plantain,  English 3+    13. Baccharis Negative    14. Dog Fennel Negative    15. Russian Thistle Negative    16. Lamb's Quarters 3+    17. Sheep Sorrell Negative    18. Rough Pigweed Negative    19. Marsh Elder, Rough 2+    20. Mugwort, Common Negative    21. Box, Elder 3+    22. Cedar, red 2+    23. Sweet Gum Negative    24. Pecan Pollen Negative    25. Pine Mix Negative    26. Walnut, Black Pollen 3+    27. Red Mulberry 3+    28. Ash Mix 3+    29. Birch Mix 3+    30.  Beech American 3+    31. Cottonwood, Guinea-Bissau Negative    32. Hickory, White 3+    33. Maple Mix Negative    34. Oak, Guinea-[place] Mix Negative    35. Sycamore Eastern Negative    36. Alternaria Alternata Negative    37. Cladosporium Herbarum Negative    38. Aspergillus Mix Negative    39. Penicillium Mix Negative    40. Bipolaris Sorokiniana (Helminthosporium) Negative    41. Drechslera Spicifera (Curvularia) Negative    42. Mucor Plumbeus Negative    43. Fusarium Moniliforme Negative    44. Aureobasidium Pullulans (pullulara) Negative    45. Rhizopus Oryzae Negative    46. Botrytis Cinera Negative    47. Epicoccum Nigrum Negative    48. Phoma Betae Negative    49. Dust Mite Mix Negative    50. Cat Hair 10,000 BAU/ml Negative    51.  Dog Epithelia Negative    52. Mixed Feathers Negative    53. Horse Epithelia Negative    54. Cockroach, German Negative    55. Tobacco Leaf Negative  Food Adult Perc - 08/14/23 0900     Time Antigen Placed 1610    Allergen Manufacturer Waynette Buttery    Location Back    Number of allergen test 16    Control-Histamine 3+    1. Peanut Negative    2. Soybean Negative    3. Wheat Negative    4. Sesame Negative    5. Milk, Cow Negative    6. Casein Negative    7. Egg White, Chicken Negative    8. Shellfish Mix Negative    9. Fish Mix Negative    10. Cashew Negative    11. Walnut Food Negative    12. Almond Negative    13. Hazelnut Negative    34. Chicken Meat Negative    36. Beef Negative              Assessment:   1. Seasonal allergic rhinitis due to pollen   2. Allergy with anaphylaxis due to food     Plan/Recommendations:  Allergic Rhinitis: - Due to turbinate hypertrophy, seasonal symptoms and unresponsive to over the counter meds, will perform skin testing to identify aeroallergen triggers.  - SPT 07/2023: trees, grasses, weeds  - Use Claritin 10 mg daily as needed for runny nose, sneezing, itchy watery eyes.     Allergic Reaction - Unclear trigger for reactions. Has had anaphylaxis symptoms after eating certain foods (chicken burrito, hibachi chicken/beef, steak, beef stew, green beans, mac and cheese) but when eaten these foods at home, he does fine.  - SPT 07/2023: negative to commonly allergenic foods, beef, chicken - sIgE 07/2023: negative to alpha gal, chicken, green bean. Normal tryptase.  If persistent, we can consider repeating tryptase.  - Keep a diary for reactions related to exercise, illness, foods, medicines.   - for SKIN only reaction, okay to take Benadryl 25mg  capsules every 6 hours as needed - for SKIN + ANY additional symptoms, OR IF concern for LIFE THREATENING reaction = Epipen Autoinjector EpiPen 0.3 mg. - If using Epinephrine autoinjector, call 911 or go to the ER.   ALLERGEN AVOIDANCE MEASURES   Pollen Avoidance Pollen levels are highest during the mid-day and afternoon.  Consider this when planning outdoor activities. Avoid being outside when the grass is being mowed, or wear a mask if the pollen-allergic person must be the one to mow the grass. Keep the windows closed to keep pollen outside of the home. Use an air conditioner to filter the air. Take a shower, wash hair, and change clothing after working or playing outdoors during pollen season.      Return in about 3 months (around 11/14/2023).  Alesia Morin, MD [facility]

## 2023-08-14 NOTE — Patient Instructions (Addendum)
Allergic Rhinitis: - Due to turbinate hypertrophy, seasonal symptoms and unresponsive to over the counter meds, will perform skin testing to identify aeroallergen triggers.  - SPT 07/2023: trees, grasses, weeds  - Use Claritin 10 mg daily as needed for runny nose, sneezing, itchy watery eyes.    Allergic Reaction - Unclear trigger for reactions. Has had anaphylaxis symptoms after eating certain foods (chicken burrito, hibachi chicken/beef, steak, beef stew, green beans, mac and cheese) but when eaten these foods at home, he does fine.  - SPT 07/2023: negative to commonly allergenic foods, beef, chicken - sIgE 07/2023: negative to alpha gal, chicken, green bean. Normal tryptase.  If persistent, we can consider repeating tryptase.  - Keep a diary for reactions related to exercise, illness, foods, medicines.   - for SKIN only reaction, okay to take Benadryl 25mg  capsules every 6 hours as needed - for SKIN + ANY additional symptoms, OR IF concern for LIFE THREATENING reaction = Epipen Autoinjector EpiPen 0.3 mg. - If using Epinephrine autoinjector, call 911 or go to the ER.   ALLERGEN AVOIDANCE MEASURES   Pollen Avoidance Pollen levels are highest during the mid-day and afternoon.  Consider this when planning outdoor activities. Avoid being outside when the grass is being mowed, or wear a mask if the pollen-allergic person must be the one to mow the grass. Keep the windows closed to keep pollen outside of the home. Use an air conditioner to filter the air. Take a shower, wash hair, and change clothing after working or playing outdoors during pollen season.

## 2023-09-14 IMAGING — CR DG CERVICAL SPINE COMPLETE 4+V
5 series · 5 of 5 positions shown · non-contrast
Comparison: None.

CLINICAL DATA: Neck pain radiates to left shoulder.

EXAM:
CERVICAL SPINE - COMPLETE 4+ VIEW

[w c spine lat]
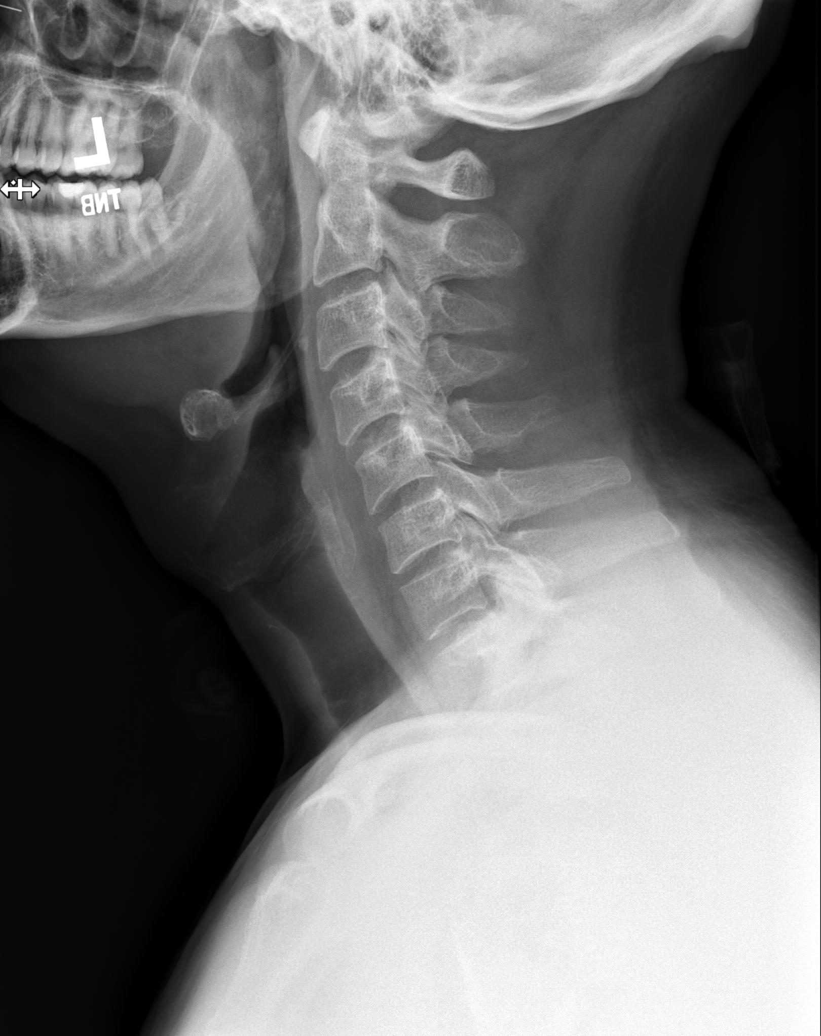

[w rpo (1 of 2)]
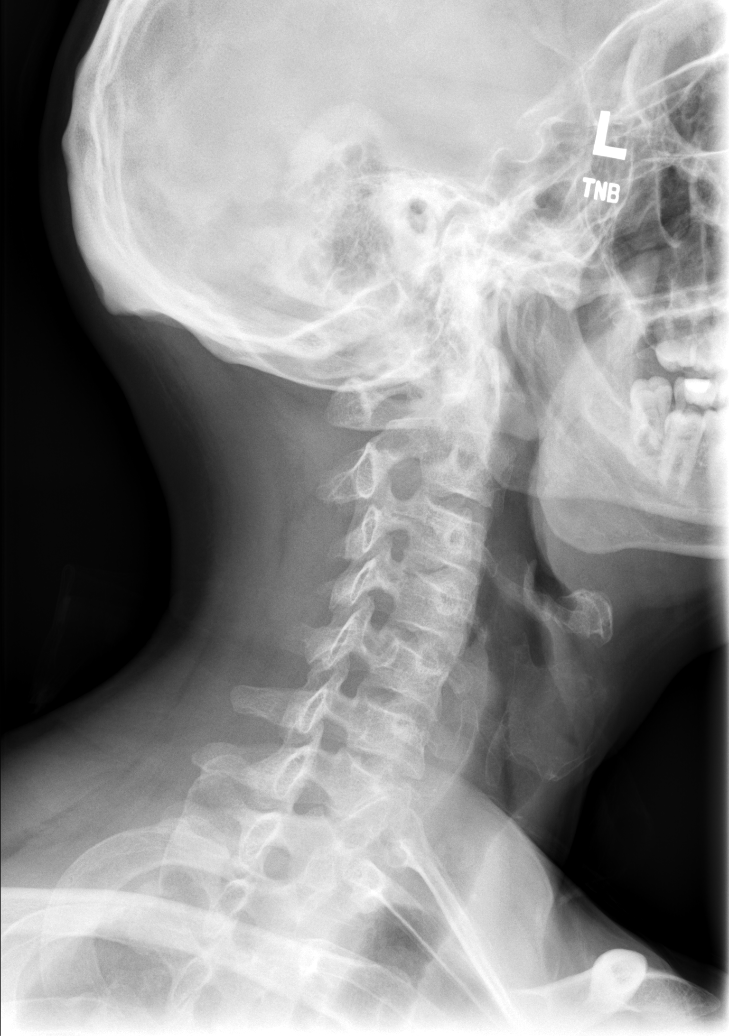

[w rpo (2 of 2)]
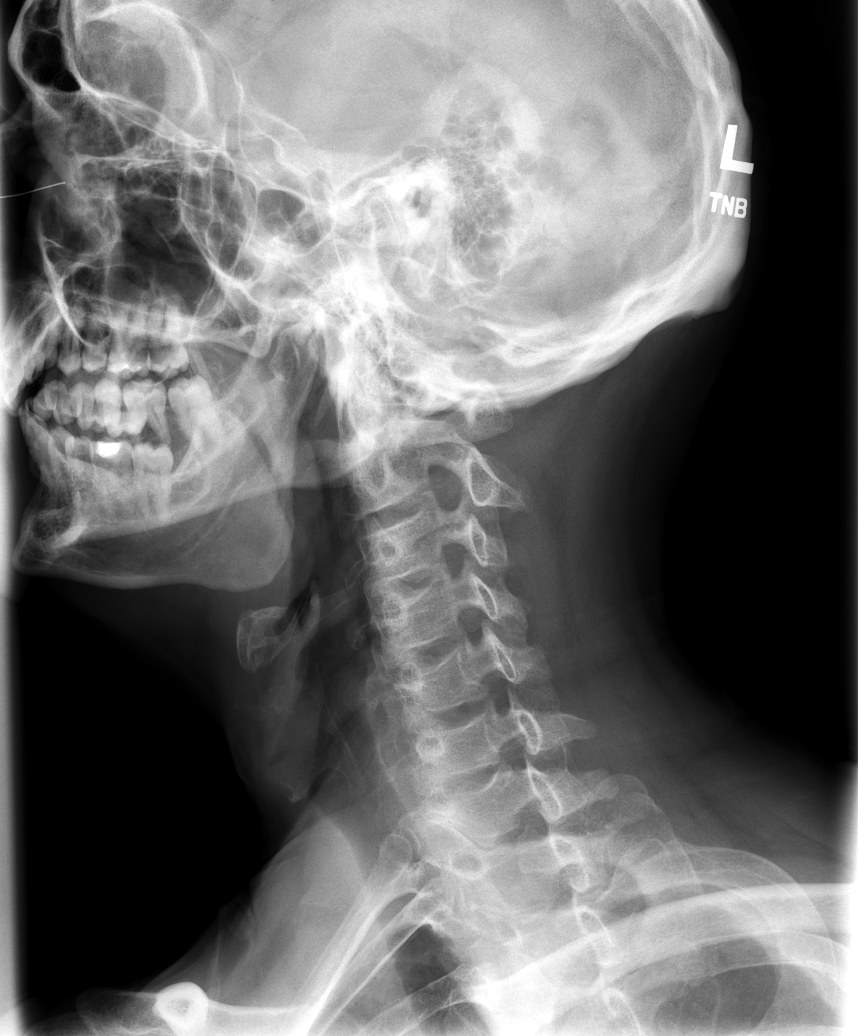

[w ap axial]
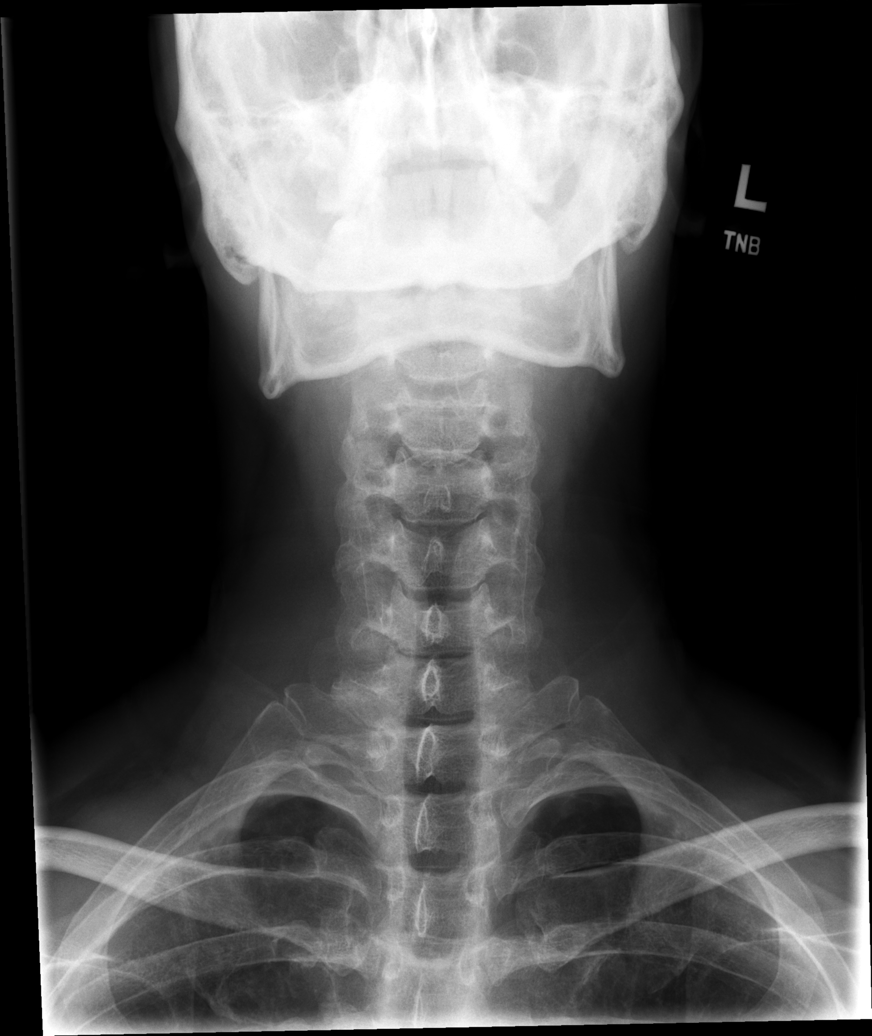

[w open mouth odontoid]
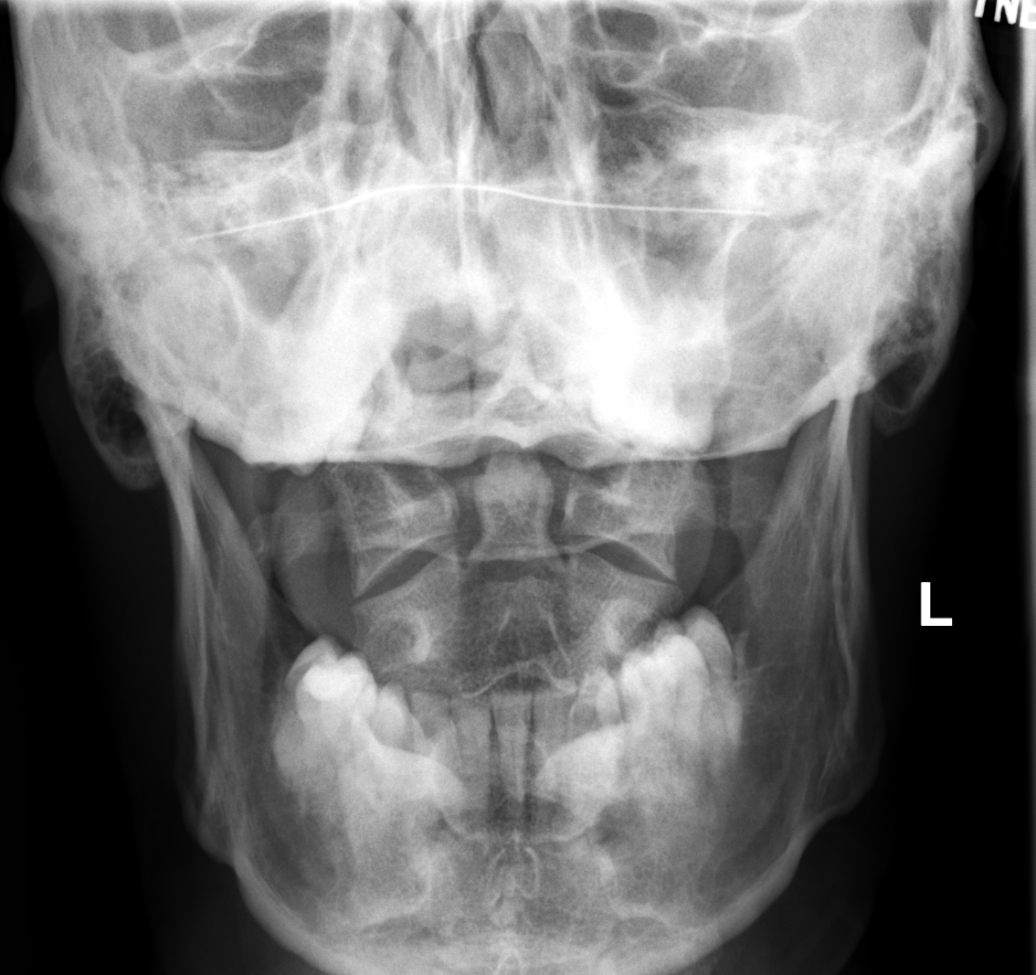

[5 of 5 positions shown; findings below may reference images not displayed]

FINDINGS: There is no evidence of cervical spine fracture or prevertebral soft
tissue swelling. Alignment is normal. No other significant bone
abnormalities are identified.
IMPRESSION: Negative cervical spine radiographs.

## 2023-10-21 ENCOUNTER — Other Ambulatory Visit: Payer: Self-pay | Admitting: Family Medicine

## 2023-10-23 ENCOUNTER — Other Ambulatory Visit: Payer: Self-pay

## 2023-10-23 MED ORDER — TAMSULOSIN HCL 0.4 MG PO CAPS: 0.4000 mg | ORAL_CAPSULE | Freq: Every day | ORAL | 3 refills | Status: DC

## 2023-11-13 ENCOUNTER — Ambulatory Visit: Payer: PRIVATE HEALTH INSURANCE | Admitting: Internal Medicine

## 2024-01-12 ENCOUNTER — Encounter: Payer: Self-pay | Admitting: Family Medicine

## 2024-01-15 ENCOUNTER — Other Ambulatory Visit: Payer: Self-pay

## 2024-01-15 DIAGNOSIS — Z0000 Encounter for general adult medical examination without abnormal findings: Secondary | ICD-10-CM

## 2024-01-15 DIAGNOSIS — E785 Hyperlipidemia, unspecified: Secondary | ICD-10-CM

## 2024-01-15 DIAGNOSIS — Z125 Encounter for screening for malignant neoplasm of prostate: Secondary | ICD-10-CM

## 2024-01-15 DIAGNOSIS — Z79899 Other long term (current) drug therapy: Secondary | ICD-10-CM

## 2024-01-15 NOTE — Telephone Encounter (Signed)
 Nurses Please order the following lab work Then send Russell Chen a [employer] letting him know this has been ordered so he can get it completed through [facility] Lipid, liver, metabolic 7, PSA, CBC  Diagnosis wellness, hyperlipidemia, screening prostate cancer  Patient should try to do the lab work in the morning time before eating food but he can certainly have water when he gets up to be well-hydrated when he goes to have his blood work drawn.  We will be able to discuss all these results with him when he comes for his physical on May 5  Thanks-Dr. Geralyn Knee

## 2024-01-17 ENCOUNTER — Encounter: Payer: Self-pay | Admitting: Family Medicine

## 2024-01-17 LAB — BASIC METABOLIC PANEL WITH GFR
BUN/Creatinine Ratio: 12 (ref 9–20)
BUN: 13 mg/dL (ref 6–24)
CO2: 24 mmol/L (ref 20–29)
Calcium: 8.9 mg/dL (ref 8.7–10.2)
Chloride: 102 mmol/L (ref 96–106)
Creatinine, Ser: 1.11 mg/dL (ref 0.76–1.27)
Glucose: 93 mg/dL (ref 70–99)
Potassium: 4.2 mmol/L (ref 3.5–5.2)
Sodium: 139 mmol/L (ref 134–144)
eGFR: 80 mL/min/{1.73_m2} (ref >59)

## 2024-01-17 LAB — CBC WITH DIFFERENTIAL/PLATELET
Basophils Absolute: 0.1 10*3/uL (ref 0.0–0.2)
Basos: 1 %
EOS (ABSOLUTE): 0.2 10*3/uL (ref 0.0–0.4)
Eos: 6 %
Hematocrit: 41.5 % (ref 37.5–51.0)
Hemoglobin: 13.6 g/dL (ref 13.0–17.7)
Immature Grans (Abs): 0 10*3/uL (ref 0.0–0.1)
Immature Granulocytes: 0 %
Lymphocytes Absolute: 1.2 10*3/uL (ref 0.7–3.1)
Lymphs: 30 %
MCH: 27.2 pg (ref 26.6–33.0)
MCHC: 32.8 g/dL (ref 31.5–35.7)
MCV: 83 fL (ref 79–97)
Monocytes Absolute: 0.4 10*3/uL (ref 0.1–0.9)
Monocytes: 11 %
Neutrophils Absolute: 2 10*3/uL (ref 1.4–7.0)
Neutrophils: 52 %
Platelets: 183 10*3/uL (ref 150–450)
RBC: 5 x10E6/uL (ref 4.14–5.80)
RDW: 13.2 % (ref 11.6–15.4)
WBC: 3.9 10*3/uL (ref 3.4–10.8)

## 2024-01-17 LAB — HEPATIC FUNCTION PANEL
ALT: 22 IU/L (ref 0–44)
AST: 22 IU/L (ref 0–40)
Albumin: 4.3 g/dL (ref 3.8–4.9)
Alkaline Phosphatase: 64 IU/L (ref 44–121)
Bilirubin Total: 0.8 mg/dL (ref 0.0–1.2)
Bilirubin, Direct: 0.23 mg/dL (ref 0.00–0.40)
Total Protein: 6.4 g/dL (ref 6.0–8.5)

## 2024-01-17 LAB — LIPID PANEL
Chol/HDL Ratio: 4.3 ratio (ref 0.0–5.0)
Cholesterol, Total: 200 mg/dL — ABNORMAL HIGH (ref 100–199)
HDL: 47 mg/dL (ref >39)
LDL Chol Calc (NIH): 139 mg/dL — ABNORMAL HIGH (ref 0–99)
Triglycerides: 77 mg/dL (ref 0–149)
VLDL Cholesterol Cal: 14 mg/dL (ref 5–40)

## 2024-01-17 LAB — PSA: Prostate Specific Ag, Serum: 2.1 ng/mL (ref 0.0–4.0)

## 2024-01-22 ENCOUNTER — Ambulatory Visit (INDEPENDENT_AMBULATORY_CARE_PROVIDER_SITE_OTHER): Payer: PRIVATE HEALTH INSURANCE | Admitting: Family Medicine

## 2024-01-22 ENCOUNTER — Encounter: Payer: Self-pay | Admitting: Family Medicine

## 2024-01-22 ENCOUNTER — Encounter: Payer: Self-pay | Admitting: Gastroenterology

## 2024-01-22 VITALS — BP 110/82 | HR 62 | Temp 98.1°F | Ht 70.28 in | Wt 194.0 lb

## 2024-01-22 DIAGNOSIS — N401 Enlarged prostate with lower urinary tract symptoms: Secondary | ICD-10-CM | POA: Diagnosis not present

## 2024-01-22 DIAGNOSIS — R1310 Dysphagia, unspecified: Secondary | ICD-10-CM

## 2024-01-22 DIAGNOSIS — Z125 Encounter for screening for malignant neoplasm of prostate: Secondary | ICD-10-CM

## 2024-01-22 DIAGNOSIS — E785 Hyperlipidemia, unspecified: Secondary | ICD-10-CM | POA: Diagnosis not present

## 2024-01-22 DIAGNOSIS — Z0000 Encounter for general adult medical examination without abnormal findings: Secondary | ICD-10-CM

## 2024-01-22 DIAGNOSIS — Z0001 Encounter for general adult medical examination with abnormal findings: Secondary | ICD-10-CM

## 2024-01-22 MED ORDER — TAMSULOSIN HCL 0.4 MG PO CAPS: 0.4000 mg | ORAL_CAPSULE | Freq: Every day | ORAL | 3 refills | Status: AC

## 2024-01-22 NOTE — Patient Instructions (Signed)

## 2024-01-22 NOTE — Progress Notes (Signed)
 Subjective:    Patient ID: Russell Chen, male    DOB: 05/24/1971, 53 y.o.   MRN: [id number]  HPI The patient comes in today for a wellness visit.    A review of their health history was completed.  A review of medications was also completed.  Any needed refills; Flomax   Eating habits: general diet-good dietary habits  Falls/  MVA accidents in past few months: none   Regular exercise: weight lifting  Specialist pt sees on regular basis: none  Preventative health issues were discussed.   Additional concerns: some trouble swallowing food.   Results for orders placed or performed in visit on 01/15/24  Lipid Panel   Collection Time: 01/16/24  7:35 AM  Result Value Ref Range   Cholesterol, Total 200 (H) 100 - 199 mg/dL   Triglycerides 77 0 - 149 mg/dL   HDL 47 >47 mg/dL   VLDL Cholesterol Cal 14 5 - 40 mg/dL   LDL Chol Calc (NIH) 829 (H) 0 - 99 mg/dL   Chol/HDL Ratio 4.3 0.0 - 5.0 ratio  Hepatic Function Panel   Collection Time: 01/16/24  7:35 AM  Result Value Ref Range   Total Protein 6.4 6.0 - 8.5 g/dL   Albumin 4.3 3.8 - 4.9 g/dL   Bilirubin Total 0.8 0.0 - 1.2 mg/dL   Bilirubin, Direct 5.62 0.00 - 0.40 mg/dL   Alkaline Phosphatase 64 44 - 121 IU/L   AST 22 0 - 40 IU/L   ALT 22 0 - 44 IU/L  Basic Metabolic Panel   Collection Time: 01/16/24  7:35 AM  Result Value Ref Range   Glucose 93 70 - 99 mg/dL   BUN 13 6 - 24 mg/dL   Creatinine, Ser 1.30 0.76 - 1.27 mg/dL   eGFR 80 >86 VH/QIO/9.62   BUN/Creatinine Ratio 12 9 - 20   Sodium 139 134 - 144 mmol/L   Potassium 4.2 3.5 - 5.2 mmol/L   Chloride 102 96 - 106 mmol/L   CO2 24 20 - 29 mmol/L   Calcium 8.9 8.7 - 10.2 mg/dL  CBC with Differential   Collection Time: 01/16/24  7:35 AM  Result Value Ref Range   WBC 3.9 3.4 - 10.8 x10E3/uL   RBC 5.00 4.14 - 5.80 x10E6/uL   Hemoglobin 13.6 13.0 - 17.7 g/dL   Hematocrit 95.2 84.1 - 51.0 %   MCV 83 79 - 97 fL   MCH 27.2 26.6 - 33.0 pg   MCHC 32.8 31.5 - 35.7 g/dL    RDW 32.4 40.1 - 02.7 %   Platelets 183 150 - 450 x10E3/uL   Neutrophils 52 Not Estab. %   Lymphs 30 Not Estab. %   Monocytes 11 Not Estab. %   Eos 6 Not Estab. %   Basos 1 Not Estab. %   Neutrophils Absolute 2.0 1.4 - 7.0 x10E3/uL   Lymphocytes Absolute 1.2 0.7 - 3.1 x10E3/uL   Monocytes Absolute 0.4 0.1 - 0.9 x10E3/uL   EOS (ABSOLUTE) 0.2 0.0 - 0.4 x10E3/uL   Basophils Absolute 0.1 0.0 - 0.2 x10E3/uL   Immature Granulocytes 0 Not Estab. %   Immature Grans (Abs) 0.0 0.0 - 0.1 x10E3/uL  PSA   Collection Time: 01/16/24  7:35 AM  Result Value Ref Range   Prostate Specific Ag, Serum 2.1 0.0 - 4.0 ng/mL   The 10-year ASCVD risk score (Arnett DK, et al., 2019) is: 3.9%   Values used to calculate the score:     Age: 17  years     Sex: Male     Is Non-Hispanic African American: No     Diabetic: No     Tobacco smoker: No     Systolic Blood Pressure: 118 mmHg     Is BP treated: No     HDL Cholesterol: 47 mg/dL     Total Cholesterol: 200 mg/dL  Patient does relate intermittent times of difficulty swallowing he states he has tried to swallow food it typically happens with food and not with liquids it is more in the upper esophagus area larynx region describes it as he feels choked like he cannot breathe and cannot swallow the food then sometimes has to vomit because of this. Review of Systems     Objective:   Physical Exam  General-in no acute distress Eyes-no discharge Lungs-respiratory rate normal, CTA CV-no murmurs,RRR Extremities skin warm dry no edema Neuro grossly normal Behavior normal, alert Prostate exam mildly enlarged soft no hard nodules Patient does have a mole that is slightly raised on the right buttock cheek near the midline above the rectum.  It does not appear to be cancer.  It is brown soft has more of appearance of a raised mole.  No signs of melanoma with this mole.  Will reexamine again in 1 years time     Assessment & Plan:  1. Well adult exam  (Primary) Adult wellness-complete.wellness physical was conducted today. Importance of diet and exercise were discussed in detail.  Importance of stress reduction and healthy living were discussed.  In addition to this a discussion regarding safety was also covered.  We also reviewed over immunizations and gave recommendations regarding current immunization needed for age.   In addition to this additional areas were also touched on including: Preventative health exams needed:  Colonoscopy up-to-date currently  Patient was advised yearly wellness exam   2. Benign prostatic hyperplasia with lower urinary tract symptoms, symptom details unspecified PSA went up to 2.1 recommending repeat PSA in 6 months time patient will do this lab work more than likely PSA will come back in a better range Continue tamsulosin  to help with urinary flow - PSA  3. Screening PSA (prostate specific antigen) Screening 6 months see above - PSA  4. Hyperlipidemia, unspecified hyperlipidemia type Mild LDL elevation but overall risk is considered low shared discussion with patient we will consider, healthy diet regular activity coronary calcium test in a few years time  5. Dysphagia, unspecified type Mild dysphagia related issues Seems to be upper esophagus.  But if GI evaluates him does esophageal dilation and he still has this issue then patient would benefit from speech therapy with a swallowing study in consultation with ENT perhaps at that time - Ambulatory referral to Gastroenterology  Wellness in 1 year  Diastolic pressure slightly elevated healthy diet aerobic activity recommended

## 2024-01-29 ENCOUNTER — Ambulatory Visit (INDEPENDENT_AMBULATORY_CARE_PROVIDER_SITE_OTHER): Payer: PRIVATE HEALTH INSURANCE | Admitting: Gastroenterology

## 2024-01-29 ENCOUNTER — Encounter: Payer: Self-pay | Admitting: Gastroenterology

## 2024-01-29 VITALS — BP 124/66 | HR 74 | Ht 70.0 in | Wt 194.0 lb

## 2024-01-29 DIAGNOSIS — R1312 Dysphagia, oropharyngeal phase: Secondary | ICD-10-CM

## 2024-01-29 DIAGNOSIS — R1110 Vomiting, unspecified: Secondary | ICD-10-CM

## 2024-01-29 NOTE — Progress Notes (Addendum)
 Chief Complaint:dysphagia Primary GI Doctor: (previously Dr. Savannah Curlin) Dr. Yvone Herd   HPI:  Patient is a 53 year old male patient with past medical history of BPH, who was referred to me by Bennet Brasil, MD on 01/22/24 for a complaint of oropharyngeal dysphagia.   Interval History Patient presents with main of dysphagia with both solids and liquids that occurs few times a week. He will sit there a few minutes and relax and it will go down. He had episode in the fall where he threw up food he had tried to eat. He was evaluated for food allergies at that time which was negative. Patient reports it has been getting worse the last several months. Patient notes he has been snoring more, a pillow wedge helps. Patient denies GERD.  Patient denies nausea, vomiting, or weight loss. Denies throat clearing. Patient denies altered bowel habits, abdominal pain, or rectal bleeding.  Socially drinks. Nonsmoker.  No known family history of GI issues or CA.   Never had EGD.  Wt Readings from Last 3 Encounters:  01/29/24 194 lb (88 kg)  01/22/24 194 lb (88 kg)  08/07/23 193 lb 12.8 oz (87.9 kg)    Past Medical History:  Diagnosis Date   Allergy     seasonal allergies   Past Surgical History:  Procedure Laterality Date   FOREIGN BODY REMOVAL  05/25/2012   Procedure: FOREIGN BODY REMOVAL ADULT;  Surgeon: Darrin Emerald, MD;  Location: [facility];  Service: Orthopedics;  Laterality: Right;  right knee foreign body   MASS EXCISION  05/25/2012   Procedure: EXCISION MASS;  Surgeon: Darrin Emerald, MD;  Location: [facility];  Service: Orthopedics;  Laterality: Right;  right knee mass   VASECTOMY     WISDOM TOOTH EXTRACTION      Current Outpatient Medications  Medication Sig Dispense Refill   loratadine  (CLARITIN ) 10 MG tablet Take 1 tablet (10 mg total) by mouth daily. 30 tablet 5   Multiple Vitamins-Minerals (MULTIVITAMIN PO) Take 1 tablet by mouth daily.      tamsulosin  (FLOMAX ) 0.4 MG CAPS capsule  Take 1 capsule (0.4 mg total) by mouth at bedtime. 90 capsule 3   No current facility-administered medications for this visit.   Allergies as of 01/29/2024   (No Known Allergies)   Family History  Problem Relation Age of Onset   Diabetes Father        pills   Cancer Sister        skin cancer   Cancer Maternal Grandmother        skin cancer   Heart disease Paternal Grandfather        BPH   Renal Disease Mother        obesity age 60   Colon polyps Neg Hx    Colon cancer Neg Hx    Esophageal cancer Neg Hx    Stomach cancer Neg Hx    Rectal cancer Neg Hx    Review of Systems:    Constitutional: No weight loss, fever, chills, weakness or fatigue HEENT: Eyes: No change in vision               Ears, Nose, Throat:  No change in hearing or congestion Skin: No rash or itching Cardiovascular: No chest pain, chest pressure or palpitations   Respiratory: No SOB or cough Gastrointestinal: See HPI and otherwise negative Genitourinary: No dysuria or change in urinary frequency Neurological: No headache, dizziness or syncope Musculoskeletal: No new muscle or joint pain  Hematologic: No bleeding or bruising Psychiatric: No history of depression or anxiety   Physical Exam:  Vital signs: BP 124/66   Pulse 74   Ht 5\' 10"  (1.778 m)   Wt 194 lb (88 kg)   BMI 27.84 kg/m   Constitutional:   Pleasant male appears to be in NAD, Well developed, Well nourished, alert and cooperative Throat: Oral cavity and pharynx without inflammation, swelling or lesion.  Respiratory: Respirations even and unlabored. Lungs clear to auscultation bilaterally.   No wheezes, crackles, or rhonchi.  Cardiovascular: Normal S1, S2. Regular rate and rhythm. No peripheral edema, cyanosis or pallor.  Gastrointestinal:  Soft, nondistended, nontender. No rebound or guarding. Normal bowel sounds. No appreciable masses or hepatomegaly. Rectal:  Not performed.  Msk:  Symmetrical without gross deformities. Without edema, no  deformity or joint abnormality.  Neurologic:  Alert and  oriented x4;  grossly normal neurologically.  Skin:   Dry and intact without significant lesions or rashes. Psychiatric: Oriented to person, place and time. Demonstrates good judgement and reason without abnormal affect or behaviors.  RELEVANT LABS AND IMAGING: CBC    Latest Ref Rng & Units 01/16/2024    7:35 AM 01/03/2023    7:36 AM 11/23/2021    7:36 AM  CBC  WBC 3.4 - 10.8 x10E3/uL 3.9  3.8  4.3   Hemoglobin 13.0 - 17.7 g/dL 16.1  09.6  04.5   Hematocrit 37.5 - 51.0 % 41.5  44.6  41.6   Platelets 150 - 450 x10E3/uL 183  182  186      CMP     Latest Ref Rng & Units 01/16/2024    7:35 AM 01/03/2023    7:36 AM 11/23/2021    7:36 AM  CMP  Glucose 70 - 99 mg/dL 93  95  91   BUN 6 - 24 mg/dL 13  15  15    Creatinine 0.76 - 1.27 mg/dL 4.09  8.11  9.14   Sodium 134 - 144 mmol/L 139  144  143   Potassium 3.5 - 5.2 mmol/L 4.2  4.8  4.2   Chloride 96 - 106 mmol/L 102  106  105   CO2 20 - 29 mmol/L 24  23  26    Calcium 8.7 - 10.2 mg/dL 8.9   9.0   Total Protein 6.0 - 8.5 g/dL 6.4   6.6   Total Bilirubin 0.0 - 1.2 mg/dL 0.8   0.5   Alkaline Phos 44 - 121 IU/L 64   61   AST 0 - 40 IU/L 22   21   ALT 0 - 44 IU/L 22   24    08/07/23 alpha gal  negative 03/02/21 colonoscopy with Dr. Beulah Brunt 10 years Impression: - Two less than 1 mm polyps in the cecum, removed with a cold snare. Resected and retrieved. - The examination was otherwise normal on direct and retroflexion views. Path:  Surgical [P], colon, cecum, polyp (2) - POLYPOID COLONIC MUCOSA WITH A PROMINENT LYMPHOID AGGREGATE(S) - NEGATIVE FOR DYSPLASIA - MULTIPLE STEP SECTIONS WERE EXAMINED   Assessment: Encounter Diagnoses  Name Primary?   Oropharyngeal dysphagia Yes   Regurgitation of food       53 year old male patient that presents with oropharyngeal dysphagia with solids and liquids that has been progressively becoming worse over the last few months.  Patient  denies GERD symptoms however states he has regurgitated food.  We discussed modified barium swallow study in addition to upper GI endoscopy with possible dilatation to  rule out strictures, rings, Barrett's, and/or EOE.  Patient would like to proceed with endoscopy, if negative will proceed with swallow study.   Plan: -Scheduled EGD in [facility] with Dr. Yvone Herd. The risks and benefits of EGD with possible biopsies and esophageal dilation were discussed with the patient who agrees to proceed. - recommend MBSS, patient would like to hold off for now.   Thank you for the courtesy of this consult. Please call me with any questions or concerns.   Ahnna Dungan, FNP-C [facility] 01/29/2024, 8:51 AM  Cc: Bennet Brasil, MD  I have reviewed the clinic note as outlined by Arlon Lamb, NP and agree with the assessment, plan and medical decision making.  Mr. Malta presents with symptoms of progressive oropharyngeal dysphagia to solids and liquids.  Has had an issue of needing to regurgitate/vomit food but no frank food impactions.  Reported to be negative for food allergy  testing.  Reasonable to proceed with EGD to rule out EOE, webs or rings strictures and perform dilation if appropriate.  Colonoscopy in 2022 disclosed to small polypoid lesions that were lymphoid aggregates -next colonoscopy due 2032.  Eugenia Hess, MD

## 2024-01-29 NOTE — Patient Instructions (Signed)
 You have been scheduled for an endoscopy. Please follow written instructions given to you at your visit today.  If you use inhalers (even only as needed), please bring them with you on the day of your procedure.  If you take any of the following medications, they will need to be adjusted prior to your procedure:   DO NOT TAKE 7 DAYS PRIOR TO TEST- Trulicity (dulaglutide) Ozempic, Wegovy (semaglutide) Mounjaro (tirzepatide) Bydureon Bcise (exanatide extended release)  DO NOT TAKE 1 DAY PRIOR TO YOUR TEST Rybelsus (semaglutide) Adlyxin (lixisenatide) Victoza (liraglutide) Byetta (exanatide) ___________________________________________________________________________  Due to recent changes in healthcare laws, you may see the results of your imaging and laboratory studies on MyChart before your provider has had a chance to review them.  We understand that in some cases there may be results that are confusing or concerning to you. Not all laboratory results come back in the same time frame and the provider may be waiting for multiple results in order to interpret others.  Please give us  48 hours in order for your provider to thoroughly review all the results before contacting the office for clarification of your results.  _______________________________________________________  If your blood pressure at your visit was 140/90 or greater, please contact your primary care physician to follow up on this.  _______________________________________________________  If you are age 14 or older, your body mass index should be between 23-30. Your Body mass index is 27.84 kg/m. If this is out of the aforementioned range listed, please consider follow up with your Primary Care Provider.  If you are age 92 or younger, your body mass index should be between 19-25. Your Body mass index is 27.84 kg/m. If this is out of the aformentioned range listed, please consider follow up with your Primary Care Provider.    ________________________________________________________  The [facility] providers would like to encourage you to use MYCHART to communicate with providers for non-urgent requests or questions.  Due to long hold times on the telephone, sending your provider a message by [facility] may be a faster and more efficient way to get a response.  Please allow 48 business hours for a response.  Please remember that this is for non-urgent requests.  _______________________________________________________ Thank you for trusting me with your gastrointestinal care. Deanna May, RNP

## 2024-02-13 NOTE — Progress Notes (Unsigned)
 [facility] History and Physical   Primary Care Physician:  Bennet Brasil, MD   Reason for Procedure:  Dysphagia  Plan:    Upper endoscopy with possible dilation     HPI: Russell Chen is a 53 y.o. male undergoing upper endoscopy with possible dilation for history of dysphagia.  Patient reports dysphagia to liquids and solids a few times a week.  Had an episode last fall when he needed to regurgitate food material he was trying to eat.  Previous evaluation for food allergies was negative.  No family history of gastrointestinal issues or malignancy.  No prior EGD.   Past Medical History:  Diagnosis Date   Allergy     seasonal allergies    Past Surgical History:  Procedure Laterality Date   FOREIGN BODY REMOVAL  05/25/2012   Procedure: FOREIGN BODY REMOVAL ADULT;  Surgeon: Darrin Emerald, MD;  Location: [facility];  Service: Orthopedics;  Laterality: Right;  right knee foreign body   MASS EXCISION  05/25/2012   Procedure: EXCISION MASS;  Surgeon: Darrin Emerald, MD;  Location: [facility];  Service: Orthopedics;  Laterality: Right;  right knee mass   VASECTOMY     WISDOM TOOTH EXTRACTION      Prior to Admission medications   Medication Sig Start Date End Date Taking? Authorizing Provider  loratadine  (CLARITIN ) 10 MG tablet Take 1 tablet (10 mg total) by mouth daily. 08/07/23   Kandice Orleans, MD  Multiple Vitamins-Minerals (MULTIVITAMIN PO) Take 1 tablet by mouth daily.     [provider]  tamsulosin  (FLOMAX ) 0.4 MG CAPS capsule Take 1 capsule (0.4 mg total) by mouth at bedtime. 01/22/24   Bennet Brasil, MD    Current Outpatient Medications  Medication Sig Dispense Refill   loratadine  (CLARITIN ) 10 MG tablet Take 1 tablet (10 mg total) by mouth daily. 30 tablet 5   Multiple Vitamins-Minerals (MULTIVITAMIN PO) Take 1 tablet by mouth daily.      tamsulosin  (FLOMAX ) 0.4 MG CAPS capsule Take 1 capsule (0.4 mg total) by mouth at bedtime. 90 capsule 3   No  current facility-administered medications for this visit.    Allergies as of 02/15/2024   (No Known Allergies)    Family History  Problem Relation Age of Onset   Diabetes Father        pills   Cancer Sister        skin cancer   Cancer Maternal Grandmother        skin cancer   Heart disease Paternal Grandfather        BPH   Renal Disease Mother        obesity age 33   Colon polyps Neg Hx    Colon cancer Neg Hx    Esophageal cancer Neg Hx    Stomach cancer Neg Hx    Rectal cancer Neg Hx     Social History   Socioeconomic History   Marital status: Married    Spouse name: Not on file   Number of children: Not on file   Years of education: college   Highest education level: Not on file  Occupational History   Not on file  Tobacco Use   Smoking status: Former    Current packs/day: 0.00    Types: Cigarettes    Start date: 11/22/1991    Quit date: 05/22/1992    Years since quitting: 31.7   Smokeless tobacco: Never  Vaping Use   Vaping status: Never Used  Substance and Sexual Activity   Alcohol use: No    Comment: 2 times per year   Drug use: No   Sexual activity: Yes    Birth control/protection: None  Other Topics Concern   Not on file  Social History Narrative   Not on file   Social Drivers of Health   Financial Resource Strain: Not on file  Food Insecurity: Not on file  Transportation Needs: Not on file  Physical Activity: Not on file  Stress: Not on file  Social Connections: Not on file  Intimate Partner Violence: Not on file    Review of Systems:  All other review of systems negative except as mentioned in the HPI.  Physical Exam: Vital signs There were no vitals taken for this visit.  General:   Alert,  Well-developed, well-nourished, pleasant and cooperative in NAD Airway:  Mallampati  Lungs:  Clear throughout to auscultation.   Heart:  Regular rate and rhythm; no murmurs, clicks, rubs,  or gallops. Abdomen:  Soft, nontender and nondistended.  Normal bowel sounds.   Neuro/Psych:  Normal mood and affect. A and O x 3  Eugenia Hess, MD [facility]

## 2024-02-15 ENCOUNTER — Ambulatory Visit: Payer: PRIVATE HEALTH INSURANCE | Admitting: Pediatrics

## 2024-02-15 ENCOUNTER — Encounter: Payer: Self-pay | Admitting: Pediatrics

## 2024-02-15 VITALS — BP 90/54 | HR 56 | Temp 97.5°F | Resp 13 | Ht 70.0 in | Wt 194.0 lb

## 2024-02-15 DIAGNOSIS — K2950 Unspecified chronic gastritis without bleeding: Secondary | ICD-10-CM | POA: Diagnosis present

## 2024-02-15 DIAGNOSIS — K317 Polyp of stomach and duodenum: Secondary | ICD-10-CM | POA: Diagnosis not present

## 2024-02-15 DIAGNOSIS — K2980 Duodenitis without bleeding: Secondary | ICD-10-CM | POA: Diagnosis not present

## 2024-02-15 DIAGNOSIS — R1312 Dysphagia, oropharyngeal phase: Secondary | ICD-10-CM

## 2024-02-15 MED ORDER — SODIUM CHLORIDE 0.9 % IV SOLN: 500.0000 mL | INTRAVENOUS | Status: DC

## 2024-02-15 NOTE — Progress Notes (Signed)
 Vss nad trans to pacu

## 2024-02-15 NOTE — Patient Instructions (Addendum)
 Follow post dilation diet today. Resume previous diet tomorrow. Awaiting pathology results. (If biopsies confirm Eosinophillic Esophagitis, initiate treatment) Return to the GI office on 8/5 for follow up appointment-Call office if you need to reschedule  YOU HAD AN ENDOSCOPIC PROCEDURE TODAY AT THE [facility]:   Refer to the procedure report that was given to you for any specific questions about what was found during the examination.  If the procedure report does not answer your questions, please call your gastroenterologist to clarify.  If you requested that your care partner not be given the details of your procedure findings, then the procedure report has been included in a sealed envelope for you to review at your convenience later.  YOU SHOULD EXPECT: Some feelings of bloating in the abdomen. Passage of more gas than usual.  Walking can help get rid of the air that was put into your GI tract during the procedure and reduce the bloating. If you had a lower endoscopy (such as a colonoscopy or flexible sigmoidoscopy) you may notice spotting of blood in your stool or on the toilet paper. If you underwent a bowel prep for your procedure, you may not have a normal bowel movement for a few days.  Please Note:  You might notice some irritation and congestion in your nose or some drainage.  This is from the oxygen used during your procedure.  There is no need for concern and it should clear up in a day or so.  SYMPTOMS TO REPORT IMMEDIATELY:  Following upper endoscopy (EGD)  Vomiting of blood or coffee ground material  New chest pain or pain under the shoulder blades  Painful or persistently difficult swallowing  New shortness of breath  Fever of 100F or higher  Black, tarry-looking stools  For urgent or emergent issues, a gastroenterologist can be reached at any hour by calling [phone]. Do not use MyChart messaging for urgent concerns.    DIET:  We do recommend a small meal  at first, but then you may proceed to your regular diet.  Drink plenty of fluids but you should avoid alcoholic beverages for 24 hours.  ACTIVITY:  You should plan to take it easy for the rest of today and you should NOT DRIVE or use heavy machinery until tomorrow (because of the sedation medicines used during the test).    FOLLOW UP: Our staff will call the number listed on your records the next business day following your procedure.  We will call around 7:15- 8:00 am to check on you and address any questions or concerns that you may have regarding the information given to you following your procedure. If we do not reach you, we will leave a message.     If any biopsies were taken you will be contacted by phone or by letter within the next 1-3 weeks.  Please call us  at [phone] if you have not heard about the biopsies in 3 weeks.    SIGNATURES/CONFIDENTIALITY: You and/or your care partner have signed paperwork which will be entered into your electronic medical record.  These signatures attest to the fact that that the information above on your After Visit Summary has been reviewed and is understood.  Full responsibility of the confidentiality of this discharge information lies with you and/or your care-partner.

## 2024-02-15 NOTE — Progress Notes (Signed)
 Called to room to assist during endoscopic procedure.  Patient ID and intended procedure confirmed with present staff. Received instructions for my participation in the procedure from the performing physician.

## 2024-02-15 NOTE — Progress Notes (Signed)
 Pt's states no medical or surgical changes since previsit or office visit.

## 2024-02-15 NOTE — Op Note (Signed)
 [facility] Patient Name: Russell Chen Procedure Date: 02/15/2024 9:34 AM MRN: [id number] Endoscopist: Eugenia Hess , MD, [id number] Age: 53 Referring MD:  Date of Birth: [birth year] Gender: Male Account #: [id number] Procedure:                Upper GI endoscopy Indications:              Dysphagia Medicines:                Monitored Anesthesia Care Procedure:                Pre-Anesthesia Assessment:                           - Prior to the procedure, a History and Physical                            was performed, and patient medications and                            allergies were reviewed. The patient's tolerance of                            previous anesthesia was also reviewed. The risks                            and benefits of the procedure and the sedation                            options and risks were discussed with the patient.                            All questions were answered, and informed consent                            was obtained. Prior Anticoagulants: The patient has                            taken no anticoagulant or antiplatelet agents. ASA                            Grade Assessment: II - A patient with mild systemic                            disease. After reviewing the risks and benefits,                            the patient was deemed in satisfactory condition to                            undergo the procedure.                           After obtaining informed consent, the endoscope was  passed under direct vision. Throughout the                            procedure, the patient's blood pressure, pulse, and                            oxygen saturations were monitored continuously. The                            Olympus Scope [id number] was introduced through the                            mouth, and advanced to the second part of duodenum.                            The upper GI endoscopy was accomplished  without                            difficulty. The patient tolerated the procedure                            well. Scope In: Scope Out: Findings:                 Mucosal changes including longitudinal furrows,                            circumferential folds, congestion (edema) and crepe                            paper esophagus were found in the entire esophagus.                            Esophageal findings were graded using the                            Eosinophilic Esophagitis Endoscopic Reference Score                            (EoE-EREFS) as: Edema Grade 1 Present (decreased                            clarity or absence of vascular markings), Rings                            Grade 0 None (no ridges or rings seen), Exudates                            Grade 0 None (no white lesions seen) and Furrows                            Grade 2 Severe (vertical lines with clear depth).  Biopsies were obtained from the proximal and distal                            esophagus with cold forceps for histology of                            suspected eosinophilic esophagitis.                           The upper esophagus was mildly tight upon                            intubation with the endoscope. A Savary dilation                            was performed. A guidewire was placed and the scope                            was withdrawn. Dilation was performed in the                            esophagus with a Savary dilator with no resistance                            at 13 mm and 14 mm and mild resistance at 15 mm.                           The gastric body, gastric antrum, cardia (on                            retroflexion) and gastric fundus (on retroflexion)                            were normal. Biopsies were taken with a cold                            forceps for Helicobacter pylori testing.                           A few diminutive sessile polyps were found in  the                            gastric fundus and in the gastric body.                           Localized moderately erythematous mucosa was found                            in the duodenal bulb. Biopsies were taken with a                            cold forceps for histology.  The second portion of the duodenum was normal. Complications:            No immediate complications. Estimated blood loss:                            Minimal. Estimated Blood Loss:     Estimated blood loss was minimal. Impression:               - Esophageal mucosal changes suggestive of                            eosinophilic esophagitis.                           - Normal gastric body, antrum, cardia and gastric                            fundus. Biopsied.                           - A few gastric polyps. These had the appearance of                            benign fundic gland polyps.                           - Erythematous duodenopathy. Biopsied.                           - Normal second portion of the duodenum.                           - Biopsies were taken with a cold forceps for                            evaluation of eosinophilic esophagitis.                           - Dilation performed in the esophagus. Dilated with                            Savary dilator to 15 mm. Recommendation:           - Discharge patient to home (ambulatory).                           - Await pathology results. If biopsies confirm                            eosinophilic esophagitis, initiate treatment with                            PPI and consider potential need for swallowed                            corticosteroid.                           -  The findings and recommendations were discussed                            with the patient's family.                           - Return to GI clinic in 2 months.                           - Patient has a contact number available for                             emergencies. The signs and symptoms of potential                            delayed complications were discussed with the                            patient. Return to normal activities tomorrow.                            Written discharge instructions were provided to the                            patient. Eugenia Hess, MD 02/15/2024 10:13:37 AM This report has been signed electronically.

## 2024-02-16 ENCOUNTER — Telehealth: Payer: Self-pay | Admitting: *Deleted

## 2024-02-16 NOTE — Telephone Encounter (Signed)
  Follow up Call-     02/15/2024    9:01 AM  Call back number  Post procedure Call Back phone  # [phone]  Permission to leave phone message Yes     Patient questions:  Do you have a fever, pain , or abdominal swelling? No. Pain Score  0 *  Have you tolerated food without any problems? Yes.    Have you been able to return to your normal activities? Yes.    Do you have any questions about your discharge instructions: Diet   No. Medications  No. Follow up visit  No.  Do you have questions or concerns about your Care? No.  Actions: * If pain score is 4 or above: No action needed, pain <4.

## 2024-02-19 ENCOUNTER — Ambulatory Visit: Payer: Self-pay | Admitting: Pediatrics

## 2024-02-19 LAB — SURGICAL PATHOLOGY

## 2024-03-01 ENCOUNTER — Telehealth: Payer: Self-pay | Admitting: Pediatrics

## 2024-03-01 MED ORDER — PANTOPRAZOLE SODIUM 40 MG PO TBEC
DELAYED_RELEASE_TABLET | ORAL | 2 refills | Status: DC
Start: 2024-03-01 — End: 2024-05-27

## 2024-03-01 NOTE — Telephone Encounter (Signed)
 Called and spoke with patient regarding recommendation for PPI. Patient has been advised that we have sent RX to [facility] as requested today. Patient is aware of his August follow up appt. Patient has been advised to Pantoprazole 40 mg daily at least 30 minutes before breakfast or on an empty stomach if he skips breakfast. Patient verbalized understanding and had no concerns at the end of the call.

## 2024-03-01 NOTE — Telephone Encounter (Signed)
 See 6/13 TE for details. Protonix 40 mg daily sent to pharmacy

## 2024-03-01 NOTE — Telephone Encounter (Signed)
 Patient calling to f/u on MyChart message. Please advise.

## 2024-03-26 ENCOUNTER — Encounter: Payer: Self-pay | Admitting: Family Medicine

## 2024-03-27 NOTE — Telephone Encounter (Signed)
 Nurses I read through Mont's message More than likely he has an impingement of a nerve causing this issue This does not necessarily mean that he has to have surgery But it does deserve a sitdown discussion along with examination to help map out what would be the best approach Please work with him to get him into a same-day slot with me this week or next week thank you-Dr. Glendia

## 2024-04-01 ENCOUNTER — Ambulatory Visit: Payer: PRIVATE HEALTH INSURANCE | Admitting: Family Medicine

## 2024-04-01 VITALS — BP 104/68 | HR 65 | Temp 99.0°F | Ht 70.0 in | Wt 190.4 lb

## 2024-04-01 DIAGNOSIS — M5386 Other specified dorsopathies, lumbar region: Secondary | ICD-10-CM

## 2024-04-01 DIAGNOSIS — G542 Cervical root disorders, not elsewhere classified: Secondary | ICD-10-CM

## 2024-04-01 NOTE — Progress Notes (Signed)
   Subjective:    Patient ID: Russell Chen, male    DOB: Feb 26, 1971, 53 y.o.   MRN: [id number]  HPI Pt  comes in today for R shoulder pain that started approx a month ago. Pt also has complaints of tingling in the L leg that started about the same time. Medications and allergies reviewed. Shoulder pain discomfort into the right inferior trapezius and rhomboid region been present over the past 4 weeks.  With some intermittent pains radiating into the shoulder and down the arm to the elbow.  The pain is a 8 out of 10 most days he suffers with throughout the whole day does not wake him up at night no weakness with that. Patient also with some tingling in the end barrier medial portion of the right leg as well as right knee region worse when he bends over to brush his teeth Has been doing a fair amount of exercise including crunches Has cut back on his exercise since having all this happened No known injury.  Review of Systems     Objective:   Physical Exam Good range of motion of the neck good range of motion of the back without symptoms Strength in arms and legs good Reflexes good Lungs clear heart regular       Assessment & Plan:  Sciatica his nerve root only in regards to neuropathic tingling but not a sign of any muscle weakness or loss no need for MRI currently supportive measures discussed  Impingement of cervical nerve root no weakness detected we did discuss the options of trying Lyrica, physical therapy-patient opts for us  to print him some stretches he can do and hold off on medicine that he will give us  updates he will follow-up in 8 weeks if not doing better in 8 weeks consider MRI  Warning signs were discussed in regards to if he starts having weakness or progressive discomfort reach out immediately

## 2024-04-21 NOTE — Progress Notes (Signed)
 "   Gastroenterology Return Visit   Referring Provider Alphonsa Glendia LABOR, MD [address] [place],  [place] [address]  Primary Care Provider Alphonsa Glendia LABOR, MD  Patient Profile: Russell Chen is a 53 y.o. male with a past medical history noteworthy for BPH who returns to the [facility] for follow-up of the problem(s) noted below.  Problem List: Eosinophilic esophagitis Dysphagia   History of Present Illness   Russell Chen was last seen in the GI office 01/29/2024   Current GI Meds  Pantoprazole  40 mg orally daily  Interval History   Discussed the use of AI scribe software for clinical note transcription with the patient, who gave verbal consent to proceed.  History of Present Illness Russell Chen returns to the office today for follow-up of solid and liquid dysphagia.  EGD 01/2024 showed endoscopic stigmata suggestive of EOE including longitudinal furrows, circumferential folds, congestion and crpe paper appearance to the esophagus.  The upper esophagus was mildly tight upon intubation with the scope and esophagus was Savary dilated to 15 mm.  Diminutive gastric polyps were seen.  Localized erythema was found in the duodenal bulb.  Biopsies showed 10 eos per high-power field in the distal esophagus and 15 eos per high-power field in the proximal esophagus.  Notation by the pathologist that the presence of superficial layering and degranulation favor EOE.  Biopsies of the stomach showed mild chronic gastritis that was H. pylori negative.  Peptic duodenitis was present in the duodenum.  In light of these findings, I advised initiation of pantoprazole  40 mg orally daily 20 to 30 minutes before meal.  At today's visit, Russell Chen reports that his dysphagia and esophageal symptoms are markedly improved Has had 1 episode of dysphagia with solid food since last visit, attributes possibly to nerves Denies GERD symptoms-no nausea, abdominal pain, regurgitation,  burping, or sour taste Tolerates all foods without dietary modification  Russell Chen denies a personal history of atopy Mother and brother have a history of asthma No family history of EOE  We spent the length of today's visit discussing the differentiation between GERD and EOE, natural history of EOE and treatment.  I advised follow-up upper endoscopy for reassessment of eosinophils to guide future treatment.       Last colonoscopy: 02/2021 - 2 lymphoid aggregates, normal Last endoscopy: 01/2024 - esophageal changes suspicious for EOE, Savary dilated to 15 mm, gastric polyps, erythematous duodenopathy  Last Abd CT/CTE/MRE: None  GI Review of Symptoms Significant for None. Otherwise negative.  General Review of Systems  Review of systems is significant for the pertinent positives and negatives as listed per the HPI.  Full ROS is otherwise negative.  Past Medical History   Past Medical History:  Diagnosis Date   Allergy     seasonal allergies   Eosinophilic esophagitis      Past Surgical History   Past Surgical History:  Procedure Laterality Date   FOREIGN BODY REMOVAL  05/25/2012   Procedure: FOREIGN BODY REMOVAL ADULT;  Surgeon: Taft FORBES Minerva, MD;  Location: [facility];  Service: Orthopedics;  Laterality: Right;  right knee foreign body   MASS EXCISION  05/25/2012   Procedure: EXCISION MASS;  Surgeon: Taft FORBES Minerva, MD;  Location: [facility];  Service: Orthopedics;  Laterality: Right;  right knee mass   VASECTOMY     WISDOM TOOTH EXTRACTION       Allergies and Medications   No Known Allergies   Current Meds  Medication Sig   loratadine  (  CLARITIN ) 10 MG tablet Take 1 tablet (10 mg total) by mouth daily.   Multiple Vitamins-Minerals (MULTIVITAMIN PO) Take 1 tablet by mouth daily.    pantoprazole  (PROTONIX ) 40 MG tablet Take 1 tablet (470 mg total) by mouth daily 30 minutes before breakfast   tamsulosin  (FLOMAX ) 0.4 MG CAPS capsule Take 1 capsule (0.4 mg total) by mouth  at bedtime.     Family His   Family History  Problem Relation Age of Onset   Renal Disease Mother        obesity age 78   Diabetes Father        pills   Cancer Sister        skin cancer   Cancer Maternal Grandmother        skin cancer   Heart disease Paternal Grandfather        BPH   Colon polyps Neg Hx    Colon cancer Neg Hx    Esophageal cancer Neg Hx    Stomach cancer Neg Hx    Rectal cancer Neg Hx     Social History   Social History   Tobacco Use   Smoking status: Former    Current packs/day: 0.00    Types: Cigarettes    Start date: 11/22/1991    Quit date: 05/22/1992    Years since quitting: 31.9   Smokeless tobacco: Never  Vaping Use   Vaping status: Never Used  Substance Use Topics   Alcohol use: No    Comment: 2 times per year   Drug use: No   Russell Chen reports that he quit smoking about 31 years ago. His smoking use included cigarettes. He started smoking about 32 years ago. He has never used smokeless tobacco. He reports that he does not drink alcohol and does not use drugs.  Vital Signs and Physical Examination   Vitals:   04/23/24 0847  BP: 128/72  Pulse: 69   Body mass index is 27.41 kg/m. Weight: 191 lb (86.6 kg)  General: Well developed, well nourished, no acute distress Head: Normocephalic and atraumatic Eyes: Sclerae anicteric, EOMI Lungs: Clear throughout to auscultation Heart: Regular rate and rhythm; No murmurs, rubs or bruits Abdomen: Soft, non tender and non distended. No masses, hepatosplenomegaly or hernias noted. Normal Bowel sounds Rectal: Deferred Musculoskeletal: Symmetrical with no gross deformities     Review of Data   The following data was reviewed at the time of this encounter:   Laboratory Studies      Latest Ref Rng & Units 01/16/2024    7:35 AM 01/03/2023    7:36 AM 11/23/2021    7:36 AM  CBC  WBC 3.4 - 10.8 x10E3/uL 3.9  3.8  4.3   Hemoglobin 13.0 - 17.7 g/dL 86.3  85.6  85.9   Hematocrit 37.5 - 51.0 % 41.5   44.6  41.6   Platelets 150 - 450 x10E3/uL 183  182  186     No results found for: LIPASE    Latest Ref Rng & Units 01/16/2024    7:35 AM 01/03/2023    7:36 AM 11/23/2021    7:36 AM  CMP  Glucose 70 - 99 mg/dL 93  95  91   BUN 6 - 24 mg/dL 13  15  15    Creatinine 0.76 - 1.27 mg/dL 8.88  8.84  8.74   Sodium 134 - 144 mmol/L 139  144  143   Potassium 3.5 - 5.2 mmol/L 4.2  4.8  4.2   Chloride  96 - 106 mmol/L 102  106  105   CO2 20 - 29 mmol/L 24  23  26    Calcium 8.7 - 10.2 mg/dL 8.9   9.0   Total Protein 6.0 - 8.5 g/dL 6.4   6.6   Total Bilirubin 0.0 - 1.2 mg/dL 0.8   0.5   Alkaline Phos 44 - 121 IU/L 64   61   AST 0 - 40 IU/L 22   21   ALT 0 - 44 IU/L 22   24      Imaging Studies  None  GI Procedures and Studies  EGD 01/2024 - Esophageal mucosal changes suggestive of eosinophilic esophagitis.  - Normal gastric body, antrum, cardia and gastric fundus. Biopsied.  - A few gastric polyps. These had the appearance of benign fundic gland polyps.  - Erythematous duodenopathy. Biopsied. - Normal second portion of the duodenum.  - Biopsies were taken with a cold forceps for evaluation of eosinophilic esophagitis.  - Dilation performed in the esophagus. Dilated with Savary dilator to 15 mm.  Path:  1. Surgical [P], duodenal bulb, 2nd portion of duodenum, and distal duodenum :      - PEPTIC DUODENITIS       2. Surgical [P], gastric antrum and gastric body :      - MILD CHRONIC GASTRITIS.      - NEGATIVE FOR H. PYLORI ON H&E STAIN      - NO INTESTINAL METAPLASIA, DYSPLASIA, OR MALIGNANCY.       3. Surgical [P], distal esophagus :      - BENIGN SQUAMOUS MUCOSA WITH INCREASED INTRAEPITHELIAL EOSINOPHILS (UP TO 10      EOSINOPHILS PER HIGH-POWER FIELD)       4. Surgical [P], upper esophagus :      - BENIGN SQUAMOUS MUCOSA WITH INCREASED INTRAEPITHELIAL EOSINOPHILS (UP TO 15      EOSINOPHILS PER HIGH-POWER FIELD)       Diagnosis Note : - Diagnostic considerations include  gastroesophageal reflux      disease and eosinophilic esophagitis.The presence of superficial layering and      degranulation favor eosinophilic esophagitis.Clinical and endoscopic correlation      is recommended.   Colonoscopy 03/02/2021 - Two less than 1 mm polyps in the cecum, removed with a cold snare. Resected and retrieved.  - The examination was otherwise normal on direct and retroflexion views.  Path: - POLYPOID COLONIC MUCOSA WITH A PROMINENT LYMPHOID AGGREGATE(S) - NEGATIVE FOR DYSPLASIA - MULTIPLE STEP SECTIONS WERE EXAMINED  Clinical Impression  It is my clinical impression that Russell Chen is a 53 y.o. male with;  Eosinophilic esophagitis Dysphagia  Russell Chen initially presented with symptoms of solid and liquid dysphagia.  Upper endoscopy 01/2024 showed a mildly tight upper esophagus and esophageal mucosal findings suggestive of EOE including longitudinal furrows, circumferential folds and a crpe paper appearance to the esophagus.  Biopsies confirmed 10-15 eos per high-power field in the upper and lower esophagus.  The pathologist noted the presence of superficial layering in degranulation favor EOE versus GERD.  In response to these findings I advised a trial of pantoprazole  40 mg orally daily.  Russell Chen returns to the office today reporting significant improvement in his dysphagia.  He has only had 1 episode of swallowing difficulties that he states also could have been related to nerves.  We discussed the chronic nature of the EOE, natural history and monitoring with endoscopic evaluations.  I have advised a follow-up upper endoscopy summer/fall 2025 to  reassess burden of eosinophils and make a determination regarding future treatment.  Plan  Continue pantoprazole  40 mg orally daily Schedule [facility] at [facility] for reevaluation of EOE.  If dysphagia recurs despite PPI can consider additional dilation if needed. Next screening colonoscopy due 02/2031   Planned Follow Up 4-6 months - to be  scheduled after EGD to review results  The patient or caregiver verbalized understanding of the material covered, with no barriers to understanding. All questions were answered. Patient or caregiver is agreeable with the plan outlined above.    It was a pleasure to see Russell Chen.  If you have any questions or concerns regarding this evaluation, do not hesitate to contact me.  Inocente Hausen, MD [facility]   I spent total of 30 minutes in both face-to-face (15 minutes interview) and non-face-to-face (15 minutes chart review, care coordination, documentation)  activities, excluding procedures performed, for the visit on the date of this encounter.  "

## 2024-04-23 ENCOUNTER — Encounter: Payer: Self-pay | Admitting: Pediatrics

## 2024-04-23 ENCOUNTER — Ambulatory Visit (INDEPENDENT_AMBULATORY_CARE_PROVIDER_SITE_OTHER): Payer: PRIVATE HEALTH INSURANCE | Admitting: Pediatrics

## 2024-04-23 VITALS — BP 128/72 | HR 69 | Ht 70.0 in | Wt 191.0 lb

## 2024-04-23 DIAGNOSIS — K200 Eosinophilic esophagitis: Secondary | ICD-10-CM

## 2024-04-23 NOTE — Patient Instructions (Addendum)
.  dottie   You have been scheduled for an endoscopy. Please follow written instructions given to you at your visit today.  If you use inhalers (even only as needed), please bring them with you on the day of your procedure.  If you take any of the following medications, they will need to be adjusted prior to your procedure:   DO NOT TAKE 7 DAYS PRIOR TO TEST- Trulicity (dulaglutide) Ozempic, Wegovy (semaglutide) Mounjaro (tirzepatide) Bydureon Bcise (exanatide extended release)  DO NOT TAKE 1 DAY PRIOR TO YOUR TEST Rybelsus (semaglutide) Adlyxin (lixisenatide) Victoza (liraglutide) Byetta (exanatide) ___________________________________________________________________________   _______________________________________________________  If your blood pressure at your visit was 140/90 or greater, please contact your primary care physician to follow up on this.  _______________________________________________________  If you are age 38 or older, your body mass index should be between 23-30. Your Body mass index is 27.41 kg/m. If this is out of the aforementioned range listed, please consider follow up with your Primary Care Provider.  If you are age 47 or younger, your body mass index should be between 19-25. Your Body mass index is 27.41 kg/m. If this is out of the aformentioned range listed, please consider follow up with your Primary Care Provider.   ________________________________________________________  The [facility] providers would like to encourage you to use MYCHART to communicate with providers for non-urgent requests or questions.  Due to long hold times on the telephone, sending your provider a message by [facility] may be a faster and more efficient way to get a response.  Please allow 48 business hours for a response.  Please remember that this is for non-urgent requests.  _______________________________________________________  [facility] is using a  team-based approach to care.  Your team is made up of your doctor and two to three APPS. Our APPS (Nurse Practitioners and Physician Assistants) work with your physician to ensure care continuity for you. They are fully qualified to address your health concerns and develop a treatment plan. They communicate directly with your gastroenterologist to care for you. Seeing the Advanced Practice Practitioners on your physician's team can help you by facilitating care more promptly, often allowing for earlier appointments, access to diagnostic testing, procedures, and other specialty referrals.   Thank you for choosing me and  Gastroenterology.  Dr.Nancy McGreal

## 2024-05-02 ENCOUNTER — Telehealth: Payer: Self-pay | Admitting: Pediatrics

## 2024-05-02 NOTE — Telephone Encounter (Signed)
 Patient call wanting to provide pre cert number he received from his insurance for upcoming procedure: [id number]. Please advise, thank you

## 2024-05-27 ENCOUNTER — Encounter: Payer: Self-pay | Admitting: Family Medicine

## 2024-05-27 ENCOUNTER — Ambulatory Visit (INDEPENDENT_AMBULATORY_CARE_PROVIDER_SITE_OTHER): Payer: PRIVATE HEALTH INSURANCE | Admitting: Family Medicine

## 2024-05-27 ENCOUNTER — Other Ambulatory Visit: Payer: Self-pay

## 2024-05-27 VITALS — BP 124/85 | HR 57 | Temp 98.0°F | Ht 70.0 in | Wt 191.0 lb

## 2024-05-27 DIAGNOSIS — M5386 Other specified dorsopathies, lumbar region: Secondary | ICD-10-CM | POA: Diagnosis not present

## 2024-05-27 DIAGNOSIS — G542 Cervical root disorders, not elsewhere classified: Secondary | ICD-10-CM | POA: Diagnosis not present

## 2024-05-27 MED ORDER — PANTOPRAZOLE SODIUM 40 MG PO TBEC: DELAYED_RELEASE_TABLET | ORAL | 3 refills | Status: AC

## 2024-05-27 NOTE — Progress Notes (Signed)
   Subjective:    Patient ID: Russell Chen, male    DOB: 01/02/1971, 53 y.o.   MRN: [id number]  HPI 8 week f/u  Cervical nerve root impingement Patient relates intermittent burning discomfort in the trapezius region in the back also comes down into the shoulder and mid humerus region occasionally having tingling in the fingers more so index and middle finger no weakness in the arm. Intermittent numbness and discomfort in the inner aspect of the left knee but no other major issues with that Overall his symptoms are doing better compared to where they were the severity of them is annoying but not incapacitating    Review of Systems     Objective:   Physical Exam Reflexes bilateral good Strength bilateral arms and legs good Blood pressure good control        Assessment & Plan:  Patient will do follow-up on his PSA in the latter half of October His PSA when he did his wellness lab work was mildly more than what it was previously but still normal  Cervical nerve root impingement actually getting better would not recommend MRI no red flags strength is good reflexes good warning signs were discussed notify us  if any problems give us  an update in 6 weeks via MyChart  Sciatica left leg improving as well no sign of weakness warning signs discussed

## 2024-06-16 NOTE — Progress Notes (Unsigned)
 [facility] History and Physical   Primary Care Physician:  Alphonsa Glendia LABOR, MD   Reason for Procedure:  Eosinophilic esophagitis  Plan:    Upper endoscopy      HPI: Russell Chen is a 53 y.o. male undergoing upper endoscopy follow-up a history of eosinophilic esophagitis and dysphagia.  Rodd initially presented with symptoms of solid and liquid dysphagia.  Upper endoscopy 01/2024 showed a mildly tight upper esophagus and esophageal mucosal findings suggestive of EOE including longitudinal furrows, circumferential folds and a crpe paper appearance to the esophagus.  Biopsies confirmed 10-15 eos per high-power field in the upper and lower esophagus.  The pathologist noted the presence of superficial layering in degranulation favor EOE versus GERD.  In response to these findings I advised a trial of pantoprazole  40 mg orally daily.  Dilation was also performed at the time of his EGD to 15 mm with a Savary dilator.  At the time of last clinic visit in August 2025, [name] reported marked improvement in symptoms of dysphagia with only 1 episode of swallowing difficulties that he thought could have been related to nerves.  Advise follow-up EGD being performed today to reassess for evidence of EOE.    Past Medical History:  Diagnosis Date   Allergy     seasonal allergies   Eosinophilic esophagitis     Past Surgical History:  Procedure Laterality Date   FOREIGN BODY REMOVAL  05/25/2012   Procedure: FOREIGN BODY REMOVAL ADULT;  Surgeon: Taft FORBES Minerva, MD;  Location: [facility];  Service: Orthopedics;  Laterality: Right;  right knee foreign body   MASS EXCISION  05/25/2012   Procedure: EXCISION MASS;  Surgeon: Taft FORBES Minerva, MD;  Location: [facility];  Service: Orthopedics;  Laterality: Right;  right knee mass   VASECTOMY     WISDOM TOOTH EXTRACTION      Prior to Admission medications   Medication Sig Start Date End Date Taking? Authorizing Provider  loratadine  (CLARITIN ) 10  MG tablet Take 1 tablet (10 mg total) by mouth daily. 08/07/23   Tobie Arleta SQUIBB, MD  Multiple Vitamins-Minerals (MULTIVITAMIN PO) Take 1 tablet by mouth daily.     [provider]  pantoprazole  (PROTONIX ) 40 MG tablet Take 1 tablet (470 mg total) by mouth daily 30 minutes before breakfast 05/27/24   Kimyatta Lecy, Inocente CHRISTELLA, MD  tamsulosin  (FLOMAX ) 0.4 MG CAPS capsule Take 1 capsule (0.4 mg total) by mouth at bedtime. 01/22/24   Alphonsa Glendia LABOR, MD    Current Outpatient Medications  Medication Sig Dispense Refill   loratadine  (CLARITIN ) 10 MG tablet Take 1 tablet (10 mg total) by mouth daily. 30 tablet 5   pantoprazole  (PROTONIX ) 40 MG tablet Take 1 tablet (470 mg total) by mouth daily 30 minutes before breakfast 90 tablet 3   tamsulosin  (FLOMAX ) 0.4 MG CAPS capsule Take 1 capsule (0.4 mg total) by mouth at bedtime. 90 capsule 3   Multiple Vitamins-Minerals (MULTIVITAMIN PO) Take 1 tablet by mouth daily.      Current Facility-Administered Medications  Medication Dose Route Frequency Provider Last Rate Last Admin   0.9 %  sodium chloride  infusion  500 mL Intravenous Continuous Miryam Mcelhinney, Inocente CHRISTELLA, MD        Allergies as of 06/17/2024   (No Known Allergies)    Family History  Problem Relation Age of Onset   Renal Disease Mother        obesity age 53   Diabetes Father  pills   Cancer Sister        skin cancer   Cancer Maternal Grandmother        skin cancer   Heart disease Paternal Grandfather        BPH   Colon polyps Neg Hx    Colon cancer Neg Hx    Esophageal cancer Neg Hx    Stomach cancer Neg Hx    Rectal cancer Neg Hx     Social History   Socioeconomic History   Marital status: Married    Spouse name: Not on file   Number of children: 2   Years of education: college   Highest education level: Not on file  Occupational History   Not on file  Tobacco Use   Smoking status: Former    Current packs/day: 0.00    Types: Cigarettes    Start date: 11/22/1991    Quit  date: 05/22/1992    Years since quitting: 32.0   Smokeless tobacco: Never  Vaping Use   Vaping status: Never Used  Substance and Sexual Activity   Alcohol use: No    Comment: 2 times per year   Drug use: No   Sexual activity: Yes    Birth control/protection: None  Other Topics Concern   Not on file  Social History Narrative   Not on file   Social Drivers of Health   Financial Resource Strain: Not on file  Food Insecurity: Not on file  Transportation Needs: Not on file  Physical Activity: Not on file  Stress: Not on file  Social Connections: Not on file  Intimate Partner Violence: Not on file    Review of Systems:  All other review of systems negative except as mentioned in the HPI.  Physical Exam: Vital signs BP 112/62   Pulse (!) 57   Temp 97.9 F (36.6 C) (Temporal)   Resp (!) 9   Ht 5' 10 (1.778 m)   Wt 191 lb (86.6 kg)   SpO2 99%   BMI 27.41 kg/m   General:   Alert,  Well-developed, well-nourished, pleasant and cooperative in NAD Airway:  Mallampati 2 Lungs:  Clear throughout to auscultation.   Heart:  Regular rate and rhythm; no murmurs, clicks, rubs,  or gallops. Abdomen:  Soft, nontender and nondistended. Normal bowel sounds.   Neuro/Psych:  Normal mood and affect. A and O x 3  Inocente Hausen, MD [facility]

## 2024-06-17 ENCOUNTER — Encounter: Payer: Self-pay | Admitting: Pediatrics

## 2024-06-17 ENCOUNTER — Ambulatory Visit: Payer: PRIVATE HEALTH INSURANCE | Admitting: Pediatrics

## 2024-06-17 VITALS — BP 97/66 | HR 48 | Temp 97.9°F | Resp 14 | Ht 70.0 in | Wt 191.0 lb

## 2024-06-17 DIAGNOSIS — K449 Diaphragmatic hernia without obstruction or gangrene: Secondary | ICD-10-CM

## 2024-06-17 DIAGNOSIS — K2100 Gastro-esophageal reflux disease with esophagitis, without bleeding: Secondary | ICD-10-CM | POA: Diagnosis not present

## 2024-06-17 DIAGNOSIS — K200 Eosinophilic esophagitis: Secondary | ICD-10-CM

## 2024-06-17 DIAGNOSIS — K317 Polyp of stomach and duodenum: Secondary | ICD-10-CM | POA: Diagnosis not present

## 2024-06-17 MED ORDER — SODIUM CHLORIDE 0.9 % IV SOLN: 500.0000 mL | INTRAVENOUS | Status: DC

## 2024-06-17 NOTE — Progress Notes (Signed)
 Sedate, gd SR, tolerated procedure well, VSS, report to RN

## 2024-06-17 NOTE — Progress Notes (Signed)
 Called to room to assist during endoscopic procedure.  Patient ID and intended procedure confirmed with present staff. Received instructions for my participation in the procedure from the performing physician.

## 2024-06-17 NOTE — Op Note (Signed)
 [facility] Patient Name: Russell Chen Procedure Date: 06/17/2024 9:01 AM MRN: [id number] Endoscopist: Inocente Hausen , MD, [id number] Age: 53 Referring MD:  Date of Birth: 1971-05-23 Gender: Male Account #: [id number] Procedure:                Upper GI endoscopy Indications:              Suspected eosinophilic esophagitis - EGD 01/2024                            showed endoscopic stigmata suspicious for                            eosinophilic esophagitis. Biopsies of the upper and                            lower esophagus revealed 10-15 eos per high-power                            field suggestive of GERD versus EOE. In the                            interim, the patient has been treated with                            pantoprazole  40 mg orally daily. Denies symptoms of                            GERD or dysphagia at the time of today's exam. Medicines:                Monitored Anesthesia Care Procedure:                Pre-Anesthesia Assessment:                           - Prior to the procedure, a History and Physical                            was performed, and patient medications and                            allergies were reviewed. The patient's tolerance of                            previous anesthesia was also reviewed. The risks                            and benefits of the procedure and the sedation                            options and risks were discussed with the patient.                            All questions were answered, and informed consent  was obtained. Prior Anticoagulants: The patient has                            taken no anticoagulant or antiplatelet agents. ASA                            Grade Assessment: I - A normal, healthy patient.                            After reviewing the risks and benefits, the patient                            was deemed in satisfactory condition to undergo the                             procedure.                           After obtaining informed consent, the endoscope was                            passed under direct vision. Throughout the                            procedure, the patient's blood pressure, pulse, and                            oxygen saturations were monitored continuously. The                            Olympus Scope [id number] was introduced through the                            mouth, and advanced to the second part of duodenum.                            The upper GI endoscopy was accomplished without                            difficulty. The patient tolerated the procedure                            well. Scope In: Scope Out: Findings:                 Mucosal changes including longitudinal furrows,                            circumferential folds and congestion (edema) were                            found in the entire esophagus. Esophageal findings                            were graded using the Eosinophilic Esophagitis  Endoscopic Reference Score (EoE-EREFS) as: Edema                            Grade 1 Present (decreased clarity or absence of                            vascular markings), Rings Grade 1 Mild (subtle                            circumferential ridges seen on esophageal                            distension), Exudates Grade 0 None (no white                            lesions seen) and Stricture none (no stricture                            found). Biopsies were obtained from the proximal                            and distal esophagus with cold forceps for                            histology of suspected eosinophilic esophagitis.                           A few diminutive sessile polyps were found in the                            gastric fundus and in the gastric body.                           The gastric body, gastric antrum, cardia (on                            retroflexion) and gastric fundus (on  retroflexion)                            were normal.                           A small hiatal hernia was present.                           The duodenal bulb and second portion of the                            duodenum were normal. Complications:            No immediate complications. Estimated blood loss:                            Minimal. Estimated Blood Loss:     Estimated blood loss was minimal. Impression:               -  Esophageal mucosal changes suggestive of                            eosinophilic esophagitis.                           - A few gastric polyps with a benign appearance                            consistent with fundic gland polyps.                           - Normal gastric body, antrum, cardia and gastric                            fundus.                           - Small hiatal hernia.                           - Normal duodenal bulb and second portion of the                            duodenum.                           - Biopsies were taken with a cold forceps for                            evaluation of eosinophilic esophagitis. Recommendation:           - Discharge patient to home (ambulatory).                           - Await pathology results.                           - Continue present medications.                           - The findings and recommendations were discussed                            with the patient.                           - Return to GI clinic as previously scheduled.                           - Patient has a contact number available for                            emergencies. The signs and symptoms of potential                            delayed complications were discussed with the  patient. Return to normal activities tomorrow.                            Written discharge instructions were provided to the                            patient. Inocente Hausen, MD 06/17/2024 9:29:11 AM This report has  been signed electronically.

## 2024-06-17 NOTE — Patient Instructions (Addendum)
-   Await pathology results.  - Continue present medications.  - The findings and recommendations were discussed     with the patient.  - Return to GI clinic as previously scheduled.  YOU HAD AN ENDOSCOPIC PROCEDURE TODAY AT THE [facility]:   Refer to the procedure report that was given to you for any specific questions about what was found during the examination.  If the procedure report does not answer your questions, please call your gastroenterologist to clarify.  If you requested that your care partner not be given the details of your procedure findings, then the procedure report has been included in a sealed envelope for you to review at your convenience later.  YOU SHOULD EXPECT: Some feelings of bloating in the abdomen. Passage of more gas than usual.  Walking can help get rid of the air that was put into your GI tract during the procedure and reduce the bloating. If you had a lower endoscopy (such as a colonoscopy or flexible sigmoidoscopy) you may notice spotting of blood in your stool or on the toilet paper. If you underwent a bowel prep for your procedure, you may not have a normal bowel movement for a few days.  Please Note:  You might notice some irritation and congestion in your nose or some drainage.  This is from the oxygen used during your procedure.  There is no need for concern and it should clear up in a day or so.  SYMPTOMS TO REPORT IMMEDIATELY:   Following upper endoscopy (EGD)  Vomiting of blood or coffee ground material  New chest pain or pain under the shoulder blades  Painful or persistently difficult swallowing  New shortness of breath  Fever of 100F or higher  Black, tarry-looking stools  For urgent or emergent issues, a gastroenterologist can be reached at any hour by calling [phone]. Do not use MyChart messaging for urgent concerns.    DIET:  We do recommend a small meal at first, but then you may proceed to your regular diet.  Drink  plenty of fluids but you should avoid alcoholic beverages for 24 hours.  ACTIVITY:  You should plan to take it easy for the rest of today and you should NOT DRIVE or use heavy machinery until tomorrow (because of the sedation medicines used during the test).    FOLLOW UP: Our staff will call the number listed on your records the next business day following your procedure.  We will call around 7:15- 8:00 am to check on you and address any questions or concerns that you may have regarding the information given to you following your procedure. If we do not reach you, we will leave a message.     If any biopsies were taken you will be contacted by phone or by letter within the next 1-3 weeks.  Please call us  at [phone] if you have not heard about the biopsies in 3 weeks.    SIGNATURES/CONFIDENTIALITY: You and/or your care partner have signed paperwork which will be entered into your electronic medical record.  These signatures attest to the fact that that the information above on your After Visit Summary has been reviewed and is understood.  Full responsibility of the confidentiality of this discharge information lies with you and/or your care-partner.

## 2024-06-18 ENCOUNTER — Telehealth: Payer: Self-pay

## 2024-06-18 NOTE — Telephone Encounter (Signed)
  Follow up Call-     06/17/2024    8:34 AM 02/15/2024    9:01 AM  Call back number  Post procedure Call Back phone  # [phone]  Permission to leave phone message Yes Yes     Patient questions:  Do you have a fever, pain , or abdominal swelling? No. Pain Score  0 *  Have you tolerated food without any problems? Yes.    Have you been able to return to your normal activities? Yes.    Do you have any questions about your discharge instructions: Diet   No. Medications  No. Follow up visit  No.  Do you have questions or concerns about your Care? No.  Actions: * If pain score is 4 or above: No action needed, pain <4.

## 2024-06-20 LAB — SURGICAL PATHOLOGY

## 2024-06-27 ENCOUNTER — Ambulatory Visit: Payer: Self-pay | Admitting: Pediatrics

## 2024-06-27 NOTE — Progress Notes (Signed)
 [facility] Return Visit   Referring Provider Alphonsa Glendia LABOR, MD [address] [place],  [place] [address]  Primary Care Provider Alphonsa Glendia LABOR, MD  Patient Profile: Russell Chen is a 53 y.o. male with a past medical history noteworthy for BPH who returns to the [facility] for follow-up of the problem(s) noted below.  Problem List: Eosinophilic esophagitis Gas   History of Present Illness   Russell Chen was last seen in the GI office 01/29/2024   Current GI Meds  Pantoprazole  40 mg orally daily  Interval History   Discussed the use of AI scribe software for clinical note transcription with the patient, who gave verbal consent to proceed.  History of Present Illness Russell Chen returns to the office today for follow-up of EoE and gas He is accompanied to the office today by his wife who provides history in conjunction with him  Russell Chen was diagnosed with EOE 01/2024 based upon symptoms of dysphagia, endoscopic appearance of esophagus and 10-15 eosinophils per high-power field at more than 1 level of the esophagus.  His upper esophagus is mildly tight upon intubation at the time of EGD and was Savary dilated to 15 mm.  Russell Chen was subsequently treated with pantoprazole  40 mg orally daily He reported resolution of times of dysphagia  Repeat EGD 05/2024 showed endoscopic stigmata of EOE that was slightly improved.  No strictures. Biopsies of the esophagus showed up to 10 eosinophils per high-power field in the lower and upper esophagus.  At the time of today's visit, Russell Chen reports that he is overall asymptomatic from an EOE perspective Symptoms of reflux, regurgitation, pyrosis, hiccups or eructation No dysphagia or odynophagia No symptoms of food impaction No abdominal pain  His wife comments that at the onset of his symptoms there was concern for possible food allergy  They report that food allergy  testing was negative In the course of  his evaluation for food allergy , Russell Chen was provided with an EpiPen  which has now expired They query if he still needs to carry an EpiPen -advised that they contact his allergy  office for recommendations  Russell Chen denies a personal history of atopy Mother and brother have a history of asthma No family history of EOE  At today's visit, Russell Chen is also endorsing symptoms of gas He does not have a documented history of lactose intolerance but has noted some increased symptoms after the consumption of dairy His wife thinks that the consumption of processed foods and fast food may also play a role Has had symptoms of mild constipation   GI Review of Symptoms Significant for None. Otherwise negative.  General Review of Systems  Review of systems is significant for the pertinent positives and negatives as listed per the HPI.  Full ROS is otherwise negative.  Past Medical History   Past Medical History:  Diagnosis Date   Allergy     seasonal allergies   Eosinophilic esophagitis      Past Surgical History   Past Surgical History:  Procedure Laterality Date   FOREIGN BODY REMOVAL  05/25/2012   Procedure: FOREIGN BODY REMOVAL ADULT;  Surgeon: Taft FORBES Minerva, MD;  Location: [facility];  Service: Orthopedics;  Laterality: Right;  right knee foreign body   MASS EXCISION  05/25/2012   Procedure: EXCISION MASS;  Surgeon: Taft FORBES Minerva, MD;  Location: [facility];  Service: Orthopedics;  Laterality: Right;  right knee mass   VASECTOMY     WISDOM TOOTH EXTRACTION  Allergies and Medications   No Known Allergies   Current Meds  Medication Sig   loratadine  (CLARITIN ) 10 MG tablet Take 1 tablet (10 mg total) by mouth daily.   Multiple Vitamins-Minerals (MULTIVITAMIN PO) Take 1 tablet by mouth daily.    pantoprazole  (PROTONIX ) 40 MG tablet Take 1 tablet (470 mg total) by mouth daily 30 minutes before breakfast   tamsulosin  (FLOMAX ) 0.4 MG CAPS capsule Take 1 capsule (0.4 mg total) by mouth at  bedtime.     Family His   Family History  Problem Relation Age of Onset   Renal Disease Mother        obesity age 53   Diabetes Father        pills   Cancer Sister        skin cancer   Cancer Maternal Grandmother        skin cancer   Heart disease Paternal Grandfather        BPH   Colon polyps Neg Hx    Colon cancer Neg Hx    Esophageal cancer Neg Hx    Stomach cancer Neg Hx    Rectal cancer Neg Hx     Social History   Social History   Tobacco Use   Smoking status: Former    Current packs/day: 0.00    Types: Cigarettes    Start date: 11/22/1991    Quit date: 05/22/1992    Years since quitting: 32.1   Smokeless tobacco: Never  Vaping Use   Vaping status: Never Used  Substance Use Topics   Alcohol use: No    Comment: 2 times per year   Drug use: No   Russell Chen reports that he quit smoking about 32 years ago. His smoking use included cigarettes. He started smoking about 32 years ago. He has never used smokeless tobacco. He reports that he does not drink alcohol and does not use drugs.  Vital Signs and Physical Examination   Vitals:   07/01/24 0826  BP: 110/74  Pulse: 67    Body mass index is 27.48 kg/m. Weight: 191 lb 8 oz (86.9 kg)  General: Well developed, well nourished, no acute distress Head: Normocephalic and atraumatic Eyes: Sclerae anicteric, EOMI Lungs: Clear throughout to auscultation Heart: Regular rate and rhythm; No murmurs, rubs or bruits Abdomen: Soft, non tender and non distended. No masses, hepatosplenomegaly or hernias noted. Normal Bowel sounds Rectal: Deferred Musculoskeletal: Symmetrical with no gross deformities     Review of Data   The following data was reviewed at the time of this encounter:   Laboratory Studies      Latest Ref Rng & Units 01/16/2024    7:35 AM 01/03/2023    7:36 AM 11/23/2021    7:36 AM  CBC  WBC 3.4 - 10.8 x10E3/uL 3.9  3.8  4.3   Hemoglobin 13.0 - 17.7 g/dL 86.3  85.6  85.9   Hematocrit 37.5 - 51.0 % 41.5   44.6  41.6   Platelets 150 - 450 x10E3/uL 183  182  186     No results found for: LIPASE    Latest Ref Rng & Units 01/16/2024    7:35 AM 01/03/2023    7:36 AM 11/23/2021    7:36 AM  CMP  Glucose 70 - 99 mg/dL 93  95  91   BUN 6 - 24 mg/dL 13  15  15    Creatinine 0.76 - 1.27 mg/dL 8.88  8.84  8.74   Sodium 134 - 144  mmol/L 139  144  143   Potassium 3.5 - 5.2 mmol/L 4.2  4.8  4.2   Chloride 96 - 106 mmol/L 102  106  105   CO2 20 - 29 mmol/L 24  23  26    Calcium 8.7 - 10.2 mg/dL 8.9   9.0   Total Protein 6.0 - 8.5 g/dL 6.4   6.6   Total Bilirubin 0.0 - 1.2 mg/dL 0.8   0.5   Alkaline Phos 44 - 121 IU/L 64   61   AST 0 - 40 IU/L 22   21   ALT 0 - 44 IU/L 22   24      Imaging Studies  None  GI Procedures and Studies  EGD 05/2024 - Esophageal mucosal changes suggestive of eosinophilic esophagitis.  - A few gastric polyps with a benign appearance consistent with fundic gland polyps.  - Normal gastric body, antrum, cardia and gastric fundus.  - Small hiatal hernia.  - Normal duodenal bulb and second portion of the duodenum.  - Biopsies were taken with a cold forceps for evaluation of eosinophilic esophagitis.   Path:      1. Surgical [P], lower esophagus :       - BENIGN SQUAMOUS MUCOSA WITH UP TO 10 EOSINOPHILS PER HIGH-POWER FIELD.SEE       NOTE.        2. Surgical [P], upper esophagus :       - BENIGN SQUAMOUS MUCOSA WITH UP TO 10 EOSINOPHILS PER HIGH-POWER FIELD.SEE       NOTE.        Diagnosis Note : - Diagnostic considerations include gastroesophageal reflux       disease and eosinophilic esophagitis.Clinical and endoscopic correlation is       recommended    EGD 01/2024 - Esophageal mucosal changes suggestive of eosinophilic esophagitis.  - Normal gastric body, antrum, cardia and gastric fundus. Biopsied.  - A few gastric polyps. These had the appearance of benign fundic gland polyps.  - Erythematous duodenopathy. Biopsied. - Normal second portion of the duodenum.   - Biopsies were taken with a cold forceps for evaluation of eosinophilic esophagitis.  - Dilation performed in the esophagus. Dilated with Savary dilator to 15 mm.  Path:  1. Surgical [P], duodenal bulb, 2nd portion of duodenum, and distal duodenum :      - PEPTIC DUODENITIS       2. Surgical [P], gastric antrum and gastric body :      - MILD CHRONIC GASTRITIS.      - NEGATIVE FOR H. PYLORI ON H&E STAIN      - NO INTESTINAL METAPLASIA, DYSPLASIA, OR MALIGNANCY.       3. Surgical [P], distal esophagus :      - BENIGN SQUAMOUS MUCOSA WITH INCREASED INTRAEPITHELIAL EOSINOPHILS (UP TO 10      EOSINOPHILS PER HIGH-POWER FIELD)       4. Surgical [P], upper esophagus :      - BENIGN SQUAMOUS MUCOSA WITH INCREASED INTRAEPITHELIAL EOSINOPHILS (UP TO 15      EOSINOPHILS PER HIGH-POWER FIELD)       Diagnosis Note : - Diagnostic considerations include gastroesophageal reflux      disease and eosinophilic esophagitis.The presence of superficial layering and      degranulation favor eosinophilic esophagitis.Clinical and endoscopic correlation      is recommended.   Colonoscopy 03/02/2021 - Two less than 1 mm polyps in the cecum, removed with a cold snare. Resected  and retrieved.  - The examination was otherwise normal on direct and retroflexion views.  Path: - POLYPOID COLONIC MUCOSA WITH A PROMINENT LYMPHOID AGGREGATE(S) - NEGATIVE FOR DYSPLASIA - MULTIPLE STEP SECTIONS WERE EXAMINED  Clinical Impression  It is my clinical impression that Russell Chen is a 53 y.o. male with;  Eosinophilic esophagitis Gas  Russell Chen initially presented spring 2025 with symptoms of solid and liquid dysphagia.  Upper endoscopy 01/2024 showed a mildly tight upper esophagus and esophageal mucosal findings suggestive of EOE including longitudinal furrows, circumferential folds and a crpe paper appearance to the esophagus.  Biopsies confirmed 10-15 eos per high-power field in the upper and lower esophagus.  The  pathologist noted the presence of superficial layering in degranulation favor EOE versus GERD.  In response to these findings I advised a trial of pantoprazole  40 mg orally daily.   After initiation of once daily pantoprazole , Russell Chen reported symptomatic improvement without further dysphagia.  Restaging EGD 04/2024 showed mild endoscopic stigmata of EOE with 10 eosinophils per high-power field at 2 levels of the esophagus.  At today's visit, we discussed that Russell Chen has met criteria for symptomatic and histologic remission.  He does have mild endoscopic findings although these may not necessarily be clinically significant.  Reviewed options of continuing his current PPI once daily or increasing to twice daily.  Potential risks of long-term PPI were reviewed-hypomagnesemia, impact on bone mineral density, enteric infection/C. difficile risk.  In this scenario, benefits appear to outweigh risks.  Russell Chen and his wife are inclined to continue once daily PPI and notify our office if symptoms change that would necessitate dose optimization or change in therapy.   Plan  Continue pantoprazole  40 mg orally daily Schedule repeat EGD for reassessment of EOE in approximately 1 year; sooner if symptoms warrant Continue to monitor for dietary correlations with symptoms of gas -lactose, processed foods If gas persists consider the use of probiotics and possibility of SIBO testing Next screening colonoscopy due 02/2031   Planned Follow Up 9-12 months  The patient or caregiver verbalized understanding of the material covered, with no barriers to understanding. All questions were answered. Patient or caregiver is agreeable with the plan outlined above.    It was a pleasure to see Russell Chen.  If you have any questions or concerns regarding this evaluation, do not hesitate to contact me.  Inocente Hausen, MD [facility]   I spent total of 30 minutes in both face-to-face (20 minutes interview) and  non-face-to-face (10 minutes chart review, care coordination, documentation)  activities, excluding procedures performed, for the visit on the date of this encounter.

## 2024-07-01 ENCOUNTER — Encounter: Payer: Self-pay | Admitting: Pediatrics

## 2024-07-01 ENCOUNTER — Ambulatory Visit (INDEPENDENT_AMBULATORY_CARE_PROVIDER_SITE_OTHER): Payer: PRIVATE HEALTH INSURANCE | Admitting: Pediatrics

## 2024-07-01 VITALS — BP 110/74 | HR 67 | Ht 70.0 in | Wt 191.5 lb

## 2024-07-01 DIAGNOSIS — R140 Abdominal distension (gaseous): Secondary | ICD-10-CM | POA: Diagnosis not present

## 2024-07-01 DIAGNOSIS — K200 Eosinophilic esophagitis: Secondary | ICD-10-CM | POA: Diagnosis not present

## 2024-07-01 NOTE — Patient Instructions (Signed)
 Follow up in 9-12 months or sooner if needed.  Thank you for entrusting me with your care and for choosing [facility], Dr. Inocente Hausen  _______________________________________________________  If your blood pressure at your visit was 140/90 or greater, please contact your primary care physician to follow up on this.  _______________________________________________________  If you are age 53 or older, your body mass index should be between 23-30. Your Body mass index is 27.48 kg/m. If this is out of the aforementioned range listed, please consider follow up with your Primary Care Provider.  If you are age 32 or younger, your body mass index should be between 19-25. Your Body mass index is 27.48 kg/m. If this is out of the aformentioned range listed, please consider follow up with your Primary Care Provider.   ________________________________________________________  The [facility] providers would like to encourage you to use MYCHART to communicate with providers for non-urgent requests or questions.  Due to long hold times on the telephone, sending your provider a message by [facility] may be a faster and more efficient way to get a response.  Please allow 48 business hours for a response.  Please remember that this is for non-urgent requests.  _______________________________________________________  [facility] is using a team-based approach to care.  Your team is made up of your doctor and two to three APPS. Our APPS (Nurse Practitioners and Physician Assistants) work with your physician to ensure care continuity for you. They are fully qualified to address your health concerns and develop a treatment plan. They communicate directly with your gastroenterologist to care for you. Seeing the Advanced Practice Practitioners on your physician's team can help you by facilitating care more promptly, often allowing for earlier appointments, access to diagnostic testing, procedures,  and other specialty referrals.

## 2024-07-06 ENCOUNTER — Ambulatory Visit: Payer: Self-pay | Admitting: Family Medicine

## 2024-07-06 LAB — PSA: Prostate Specific Ag, Serum: 0.7 ng/mL (ref 0.0–4.0)

## 2024-07-10 ENCOUNTER — Ambulatory Visit (INDEPENDENT_AMBULATORY_CARE_PROVIDER_SITE_OTHER): Payer: PRIVATE HEALTH INSURANCE | Admitting: Family Medicine

## 2024-07-10 VITALS — BP 105/70 | HR 70 | Temp 98.1°F | Ht 70.0 in | Wt 192.0 lb

## 2024-07-10 DIAGNOSIS — R2231 Localized swelling, mass and lump, right upper limb: Secondary | ICD-10-CM | POA: Insufficient documentation

## 2024-07-10 NOTE — Patient Instructions (Signed)
 Referral placed - Dr. Camella  [facility] [address] ([address] 2) [place], [place] [address]  Contact Phone: [phone]  Fax: [phone]

## 2024-07-10 NOTE — Progress Notes (Signed)
 Subjective:  Patient ID: Russell Chen, male    DOB: 07/12/1971  Age: 53 y.o. MRN: [id number]  CC:   Chief Complaint  Patient presents with   lump on hand     Right hand ring finger , feels numbness and stiffness when moving hand - 1 to 2 months - stiffness is worsening     HPI:  53 year old male presents for evaluation of the above.  Patient reports that over the past couple of months he has had a palpable lump on the palmar aspect of his right ring finger near the MCP joint.  He reports associated stiffness.  He has had some numbness.  He states that the area causes him discomfort with repeated use of the finger such as with grasping objects.  He states that this is bothering him and he wanted to get it checked out today.  No reported trauma or injury.  Patient Active Problem List   Diagnosis Date Noted   Nodule of finger of right hand 07/10/2024   Musculoskeletal pain 09/06/2021   Ganglion cyst 05/28/2012    Social Hx   Social History   Socioeconomic History   Marital status: Married    Spouse name: Not on file   Number of children: 2   Years of education: college   Highest education level: Not on file  Occupational History   Not on file  Tobacco Use   Smoking status: Former    Current packs/day: 0.00    Types: Cigarettes    Start date: 11/22/1991    Quit date: 05/22/1992    Years since quitting: 32.1   Smokeless tobacco: Never  Vaping Use   Vaping status: Never Used  Substance and Sexual Activity   Alcohol use: No    Comment: 2 times per year   Drug use: No   Sexual activity: Yes    Birth control/protection: None  Other Topics Concern   Not on file  Social History Narrative   Not on file   Social Drivers of Health   Financial Resource Strain: Not on file  Food Insecurity: Not on file  Transportation Needs: Not on file  Physical Activity: Not on file  Stress: Not on file  Social Connections: Not on file    Review of Systems Per HPI  Objective:   BP 105/70   Pulse 70   Temp 98.1 F (36.7 C)   Ht 5' 10 (1.778 m)   Wt 192 lb (87.1 kg)   SpO2 96%   BMI 27.55 kg/m      07/10/2024    1:43 PM 07/01/2024    8:26 AM 06/17/2024    9:43 AM  BP/Weight  Systolic BP 105 110 97  Diastolic BP 70 74 66  Wt. (Lbs) 192 191.5   BMI 27.55 kg/m2 27.48 kg/m2     Physical Exam Musculoskeletal:     Comments: Palpable firm nodule noted to the palmar aspect of the ring finger just distal to the MCP joint.  Slightly tender to palpation.  Patient endorses numbness of his finger when I apply pressure.     Lab Results  Component Value Date   WBC 3.9 01/16/2024   HGB 13.6 01/16/2024   HCT 41.5 01/16/2024   PLT 183 01/16/2024   GLUCOSE 93 01/16/2024   CHOL 200 (H) 01/16/2024   TRIG 77 01/16/2024   HDL 47 01/16/2024   LDLCALC 139 (H) 01/16/2024   ALT 22 01/16/2024   AST 22 01/16/2024   NA  139 01/16/2024   K 4.2 01/16/2024   CL 102 01/16/2024   CREATININE 1.11 01/16/2024   BUN 13 01/16/2024   CO2 24 01/16/2024     Assessment & Plan:  Nodule of finger of right hand Assessment & Plan: Likely benign.  Suspected cyst.  Referring to hand surgery for evaluation and consideration for removal.  Orders: -     Ambulatory referral to Hand Surgery   Follow-up:  Return if symptoms worsen or fail to improve.  Jacqulyn Ahle DO [facility]

## 2024-07-10 NOTE — Assessment & Plan Note (Signed)
 Likely benign.  Suspected cyst.  Referring to hand surgery for evaluation and consideration for removal.

## 2025-01-29 ENCOUNTER — Encounter: Payer: PRIVATE HEALTH INSURANCE | Admitting: Family Medicine
